# Patient Record
Sex: Female | Born: 1980 | Race: Black or African American | Hispanic: No | Marital: Single | Smoking: Former smoker
Health system: Southern US, Community
[De-identification: ages and names within clinical notes are randomized; demographics above are authoritative.]

## PROBLEM LIST (undated history)

## (undated) DIAGNOSIS — I10 Essential (primary) hypertension: Secondary | ICD-10-CM

## (undated) DIAGNOSIS — I2510 Atherosclerotic heart disease of native coronary artery without angina pectoris: Secondary | ICD-10-CM

## (undated) DIAGNOSIS — G43109 Migraine with aura, not intractable, without status migrainosus: Secondary | ICD-10-CM

## (undated) DIAGNOSIS — R87613 High grade squamous intraepithelial lesion on cytologic smear of cervix (HGSIL): Secondary | ICD-10-CM

## (undated) DIAGNOSIS — R51 Headache: Secondary | ICD-10-CM

## (undated) DIAGNOSIS — F419 Anxiety disorder, unspecified: Secondary | ICD-10-CM

## (undated) HISTORY — DX: Migraine with aura, not intractable, without status migrainosus: G43.109

## (undated) HISTORY — PX: DILATION AND CURETTAGE OF UTERUS: SHX78

## (undated) HISTORY — DX: High grade squamous intraepithelial lesion on cytologic smear of cervix (HGSIL): R87.613

## (undated) HISTORY — DX: Atherosclerotic heart disease of native coronary artery without angina pectoris: I25.10

## (undated) HISTORY — PX: OTHER SURGICAL HISTORY: SHX169

## (undated) HISTORY — DX: Anxiety disorder, unspecified: F41.9

---
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
## 1999-03-02 ENCOUNTER — Encounter: Admission: RE | Admit: 1999-03-02 | Discharge: 1999-03-02 | Payer: Self-pay | Admitting: Sports Medicine

## 1999-07-20 ENCOUNTER — Emergency Department (HOSPITAL_COMMUNITY): Admission: EM | Admit: 1999-07-20 | Discharge: 1999-07-20 | Payer: Self-pay | Admitting: Emergency Medicine

## 2000-08-07 ENCOUNTER — Encounter: Admission: RE | Admit: 2000-08-07 | Discharge: 2000-08-07 | Payer: Self-pay | Admitting: Family Medicine

## 2001-01-12 ENCOUNTER — Emergency Department (HOSPITAL_COMMUNITY): Admission: EM | Admit: 2001-01-12 | Discharge: 2001-01-12 | Payer: Self-pay | Admitting: Emergency Medicine

## 2001-08-14 ENCOUNTER — Encounter: Admission: RE | Admit: 2001-08-14 | Discharge: 2001-08-14 | Payer: Self-pay | Admitting: Family Medicine

## 2002-06-17 ENCOUNTER — Encounter: Admission: RE | Admit: 2002-06-17 | Discharge: 2002-06-17 | Payer: Self-pay | Admitting: Family Medicine

## 2002-11-19 ENCOUNTER — Encounter: Admission: RE | Admit: 2002-11-19 | Discharge: 2002-11-19 | Payer: Self-pay | Admitting: Family Medicine

## 2002-12-26 ENCOUNTER — Encounter: Admission: RE | Admit: 2002-12-26 | Discharge: 2002-12-26 | Payer: Self-pay | Admitting: Sports Medicine

## 2003-01-15 ENCOUNTER — Emergency Department (HOSPITAL_COMMUNITY): Admission: EM | Admit: 2003-01-15 | Discharge: 2003-01-15 | Payer: Self-pay | Admitting: *Deleted

## 2004-03-02 ENCOUNTER — Emergency Department (HOSPITAL_COMMUNITY): Admission: EM | Admit: 2004-03-02 | Discharge: 2004-03-02 | Payer: Self-pay | Admitting: Emergency Medicine

## 2004-03-18 ENCOUNTER — Inpatient Hospital Stay (HOSPITAL_COMMUNITY): Admission: AD | Admit: 2004-03-18 | Discharge: 2004-03-18 | Payer: Self-pay | Admitting: Obstetrics & Gynecology

## 2004-03-22 ENCOUNTER — Encounter: Admission: RE | Admit: 2004-03-22 | Discharge: 2004-03-22 | Payer: Self-pay | Admitting: Family Medicine

## 2004-03-23 ENCOUNTER — Encounter (INDEPENDENT_AMBULATORY_CARE_PROVIDER_SITE_OTHER): Payer: Self-pay | Admitting: *Deleted

## 2004-03-23 ENCOUNTER — Encounter: Admission: RE | Admit: 2004-03-23 | Discharge: 2004-03-23 | Payer: Self-pay | Admitting: Family Medicine

## 2006-04-28 ENCOUNTER — Ambulatory Visit: Payer: Self-pay | Admitting: Family Medicine

## 2006-04-29 ENCOUNTER — Emergency Department (HOSPITAL_COMMUNITY): Admission: EM | Admit: 2006-04-29 | Discharge: 2006-04-29 | Payer: Self-pay | Admitting: Family Medicine

## 2006-05-09 ENCOUNTER — Ambulatory Visit: Payer: Self-pay | Admitting: Sports Medicine

## 2006-06-12 ENCOUNTER — Ambulatory Visit: Payer: Self-pay | Admitting: Family Medicine

## 2006-06-15 ENCOUNTER — Encounter: Admission: RE | Admit: 2006-06-15 | Discharge: 2006-06-15 | Payer: Self-pay | Admitting: Sports Medicine

## 2007-02-06 ENCOUNTER — Encounter (INDEPENDENT_AMBULATORY_CARE_PROVIDER_SITE_OTHER): Payer: Self-pay | Admitting: Family Medicine

## 2007-02-06 ENCOUNTER — Ambulatory Visit: Payer: Self-pay | Admitting: Family Medicine

## 2007-02-06 LAB — CONVERTED CEMR LAB
Chlamydia, DNA Probe: NEGATIVE
HCT: 37.5 % (ref 36.0–46.0)
MCV: 85.2 fL (ref 78.0–100.0)
Monocytes Absolute: 0.4 10*3/uL (ref 0.2–0.7)
Monocytes Relative: 5 % (ref 3–11)
Neutro Abs: 4.7 10*3/uL (ref 1.7–7.7)
Neutrophils Relative %: 59 % (ref 43–77)
Platelets: 316 10*3/uL (ref 150–400)

## 2007-02-15 DIAGNOSIS — N938 Other specified abnormal uterine and vaginal bleeding: Secondary | ICD-10-CM | POA: Insufficient documentation

## 2007-02-16 ENCOUNTER — Encounter (INDEPENDENT_AMBULATORY_CARE_PROVIDER_SITE_OTHER): Payer: Self-pay | Admitting: *Deleted

## 2008-05-07 ENCOUNTER — Telehealth: Payer: Self-pay | Admitting: *Deleted

## 2008-05-07 ENCOUNTER — Ambulatory Visit: Payer: Self-pay | Admitting: Family Medicine

## 2008-05-07 DIAGNOSIS — K089 Disorder of teeth and supporting structures, unspecified: Secondary | ICD-10-CM | POA: Insufficient documentation

## 2008-06-30 ENCOUNTER — Encounter: Payer: Self-pay | Admitting: *Deleted

## 2008-07-17 ENCOUNTER — Ambulatory Visit: Payer: Self-pay | Admitting: Family Medicine

## 2008-07-17 DIAGNOSIS — M79609 Pain in unspecified limb: Secondary | ICD-10-CM | POA: Insufficient documentation

## 2010-05-31 ENCOUNTER — Emergency Department (HOSPITAL_COMMUNITY): Admission: EM | Admit: 2010-05-31 | Discharge: 2010-06-01 | Payer: Self-pay | Admitting: Emergency Medicine

## 2010-12-14 ENCOUNTER — Ambulatory Visit: Payer: Self-pay | Admitting: Family Medicine

## 2010-12-14 DIAGNOSIS — G43909 Migraine, unspecified, not intractable, without status migrainosus: Secondary | ICD-10-CM | POA: Insufficient documentation

## 2011-01-20 NOTE — Assessment & Plan Note (Signed)
Summary: CPE/headaches   Vital Signs:  Patient profile:   30 year old female Height:      65.5 inches Weight:      175.6 pounds BMI:     28.88 Temp:     98.7 degrees F oral Pulse rate:   97 / minute BP sitting:   139 / 96  (right arm) Cuff size:   regular  Vitals Entered By: Jimmy Footman, CMA (December 14, 2010 11:41 AM) CC: cpe w/o pap Is Patient Diabetic? No Pain Assessment Patient in pain? no        Primary Care Mak[place] Bare:  . [facility]  CC:  cpe w/o pap.  History of Present Illness: Pt is doing well, she has no complaints other than her headaches.   Headaches: Pt comes in with headaches that are becoming more constant. She is sensitive to light, finds that they are often Rt sided, they occure about 2x a week. she has had a CT scan and MRI about 2 months ago. She is not taking any regular medicine for her headaches. She has no insurance and can not afford expensice migraine medicine right now. Occasionally take Flexerill for her headaches but says this doesn't work well.     Habits & Providers  Alcohol-Tobacco-Diet     Tobacco Status: never  Current Medications (verified): 1)  Propranolol Hcl 10 Mg Tabs (Propranolol Hcl) .... Take 3 Tablets, Every 8 Hours For 1 Month.  Allergies (verified): No Known Drug Allergies  Social History: Smoking Status:  never  Review of Systems        vitals reviewed and pertinent negatives and positives seen in HPI   Physical Exam  General:  Well-developed,well-nourished,in no acute distress; alert,appropriate and cooperative throughout examination Head:  Normocephalic and atraumatic without obvious abnormalities. No apparent alopecia or balding. Eyes:  No corneal or conjunctival inflammation noted. EOMI. [place].  Vision grossly normal. Ears:  External ear exam shows no significant lesions or deformities.  Otoscopic examination reveals clear canals, tympanic membranes are intact bilaterally without bulging, retraction,  inflammation or discharge. Hearing is grossly normal bilaterally. Nose:  External nasal examination shows no deformity or inflammation. Nasal mucosa are pink and moist without lesions or exudates. Mouth:  Oral mucosa and oropharynx without lesions or exudates.  Teeth in good repair. Neck:  No deformities, masses, or tenderness noted. Lungs:  Normal respiratory effort, chest expands symmetrically. Lungs are clear to auscultation, no crackles or wheezes. Heart:  Normal rate and regular rhythm. S1 and S2 normal without gallop, murmur, click, rub or other extra sounds. Abdomen:  Bowel sounds positive,abdomen soft and non-tender without masses, organomegaly or hernias noted. Msk:  No deformity or scoliosis noted of thoracic or lumbar spine.   Extremities:  No clubbing, cyanosis, edema, or deformity noted with normal full range of motion of all joints.   Skin:  Intact without suspicious lesions or rashes Psych:  Cognition and judgment appear intact. Alert and cooperative with normal attention span and concentration. No apparent delusions, illusions, hallucinations   Impression & Recommendations:  Problem # 1:  PHYSICAL EXAMINATION (ICD-V70.0) Assessment Unchanged Pt is doing well, except she has headaches...see below.   Orders: [facility] - Est  18-39 yrs (16109)  Problem # 2:  MIGRAINE HEADACHE (ICD-346.90) Assessment: New Pt meets criteria for migraine headaches. She has had imaging which was negative. She can't afford the abortive meds but we discussed starting a preventive medicine to see if she can decrease her frequency of headaches. Warned of possible  side effects with low BP.   Her updated medication list for this problem includes:    Propranolol Hcl 10 Mg Tabs (Propranolol hcl) .Marland Kitchen... Take 3 tablets, every 8 hours for 1 month.  Orders: [facility] - Est  18-39 yrs (41324)  Complete Medication List: 1)  Propranolol Hcl 10 Mg Tabs (Propranolol hcl) .... Take 3 tablets, every 8 hours for 1  month.  Patient Instructions: 1)  given verbal instructions Prescriptions: PROPRANOLOL HCL 10 MG TABS (PROPRANOLOL HCL) take 3 tablets, every 8 hours for 1 month.  #180 x 1   Entered and Authorized by:   Jamie Brookes MD   Signed by:   Jamie Brookes MD on 12/14/2010   Method used:   Electronically to        [id number] (retail)       [address] [place], [place]  [address]       Ph: [phone]       Fax: [phone]   RxID:   [id number]    Orders Added: 1)  [facility] - Est  18-39 yrs [60109]

## 2011-03-07 LAB — CBC
HCT: 36.4 % (ref 36.0–46.0)
Hemoglobin: 12.2 g/dL (ref 12.0–15.0)
MCV: 89.1 fL (ref 78.0–100.0)
Platelets: 300 10*3/uL (ref 150–400)
RBC: 4.09 MIL/uL (ref 3.87–5.11)

## 2011-03-07 LAB — BASIC METABOLIC PANEL
BUN: 9 mg/dL (ref 6–23)
Creatinine, Ser: 0.67 mg/dL (ref 0.4–1.2)
GFR calc Af Amer: >60 mL/min (ref >60)
GFR calc non Af Amer: >60 mL/min (ref >60)

## 2011-03-07 LAB — URINALYSIS, ROUTINE W REFLEX MICROSCOPIC
Glucose, UA: NEGATIVE mg/dL
Nitrite: NEGATIVE
Protein, ur: NEGATIVE mg/dL
Specific Gravity, Urine: 1.008 (ref 1.005–1.030)
Urobilinogen, UA: 0.2 mg/dL (ref 0.0–1.0)
pH: 6.5 (ref 5.0–8.0)

## 2011-03-07 LAB — DIFFERENTIAL
Lymphs Abs: 2.9 10*3/uL (ref 0.7–4.0)
Monocytes Absolute: 0.4 10*3/uL (ref 0.1–1.0)
Neutro Abs: 5.3 10*3/uL (ref 1.7–7.7)
Neutrophils Relative %: 60 % (ref 43–77)

## 2011-03-07 LAB — URINE MICROSCOPIC-ADD ON

## 2011-03-07 LAB — PROTIME-INR: Prothrombin Time: 13 seconds (ref 11.6–15.2)

## 2011-03-07 LAB — POCT PREGNANCY, URINE: Preg Test, Ur: NEGATIVE

## 2011-03-24 ENCOUNTER — Encounter: Payer: Self-pay | Admitting: Family Medicine

## 2011-03-31 ENCOUNTER — Encounter: Payer: Self-pay | Admitting: Family Medicine

## 2011-04-06 ENCOUNTER — Other Ambulatory Visit (HOSPITAL_COMMUNITY)
Admission: RE | Admit: 2011-04-06 | Discharge: 2011-04-06 | Disposition: A | Discharge status: Home / Self Care | Payer: PRIVATE HEALTH INSURANCE | Source: Ambulatory Visit | Attending: Family Medicine | Admitting: Family Medicine

## 2011-04-07 ENCOUNTER — Ambulatory Visit (INDEPENDENT_AMBULATORY_CARE_PROVIDER_SITE_OTHER): Payer: PRIVATE HEALTH INSURANCE | Admitting: Family Medicine

## 2011-04-07 ENCOUNTER — Encounter: Payer: Self-pay | Admitting: Family Medicine

## 2011-04-07 VITALS — BP 132/84 | HR 80 | Temp 98.8°F | Ht 65.0 in | Wt 176.0 lb

## 2011-04-07 DIAGNOSIS — Z124 Encounter for screening for malignant neoplasm of cervix: Secondary | ICD-10-CM

## 2011-04-07 DIAGNOSIS — N760 Acute vaginitis: Secondary | ICD-10-CM

## 2011-04-07 DIAGNOSIS — Z01419 Encounter for gynecological examination (general) (routine) without abnormal findings: Secondary | ICD-10-CM | POA: Insufficient documentation

## 2011-04-07 NOTE — Progress Notes (Signed)
  Subjective:    Patient ID: Kaylee Spencer, female    DOB: 09/05/1981, 30 y.o.   MRN: [id number]  HPI  Had physical exam 12/14/10.  Did not hav opportunity to get pap smear, here to collect pap today.  Vaginitis: not currently having today but notes has this cyclically with menses.  States cyclically premenstrually with odor.  Has tried OTC supposities without improvement.  Patient is distressed by this vaginal odor.  No pelvic pain.  Review of Systemssee hpi    Objective:   Physical Exam  Constitutional: She appears well-developed and well-nourished.  Genitourinary: Vagina normal and uterus normal. There is no rash, tenderness, lesion or injury on the right labia. There is no rash, tenderness, lesion or injury on the left labia. Cervix exhibits no motion tenderness, no discharge and no friability. Right adnexum displays no mass, no tenderness and no fullness. Left adnexum displays no mass and no fullness. No bleeding around the vagina.          Assessment & Plan:

## 2011-04-07 NOTE — Patient Instructions (Signed)
Schedule follow-up with your PCP as needed Will send you results of pap test in next 1-2 weeks.  If abnormal, will call to discuss

## 2011-04-11 DIAGNOSIS — N760 Acute vaginitis: Secondary | ICD-10-CM | POA: Insufficient documentation

## 2011-04-11 NOTE — Assessment & Plan Note (Signed)
Since not currently occuring, will not check wet prep.  Advised patient on normal change in vaginal mucous in menstrual cycle.  She will return when having vaginal discharge if testing desired.

## 2011-04-13 ENCOUNTER — Encounter: Payer: Self-pay | Admitting: Family Medicine

## 2011-07-22 ENCOUNTER — Ambulatory Visit (INDEPENDENT_AMBULATORY_CARE_PROVIDER_SITE_OTHER): Payer: PRIVATE HEALTH INSURANCE | Admitting: Family Medicine

## 2011-07-22 VITALS — BP 126/74 | HR 101 | Temp 98.5°F | Wt 182.0 lb

## 2011-07-22 DIAGNOSIS — G43909 Migraine, unspecified, not intractable, without status migrainosus: Secondary | ICD-10-CM

## 2011-07-22 MED ORDER — SUMATRIPTAN SUCCINATE 50 MG PO TABS: ORAL_TABLET | ORAL | Status: DC

## 2011-07-22 MED ORDER — TOPIRAMATE 25 MG PO TABS: 25.0000 mg | ORAL_TABLET | Freq: Every day | ORAL | Status: DC

## 2011-07-22 MED ORDER — KETOROLAC TROMETHAMINE 30 MG/ML IJ SOLN
30.0000 mg | Freq: Once | INTRAMUSCULAR | Status: AC
  Administered 2011-07-22: 30 mg via INTRAMUSCULAR

## 2011-07-22 NOTE — Assessment & Plan Note (Signed)
Migraine vs medication overuse headache.  toradol in office today  Start topamax for prevention Wean NSAIDs Headache log to go back to PCP with ? [facility] to [facility]

## 2011-07-22 NOTE — Progress Notes (Signed)
Addended by: Garen Grams F on: 07/22/2011 09:57 AM   Modules accepted: Orders

## 2011-07-22 NOTE — Progress Notes (Signed)
  Subjective:    Kaylee Spencer is a 30 y.o. female who presents for follow-up of migraine headaches and rebound headaches due to excessive medication use. Home treatment has included motrin and aleve with little improvement. Headaches are occurring continuously. Generally, the headaches last about several days. Work attendance or other daily activities are not affected by the headaches. The patient denies depression, loss of balance, speech difficulties and vision problems.     Review of Systems A comprehensive review of systems was negative.    Objective:    BP 126/74  Pulse 101  Temp(Src) 98.5 F (36.9 C) (Oral)  Wt 182 lb (82.555 kg)  LMP 07/22/2011 General appearance: alert, cooperative and mild distress Neurologic: Alert and oriented X 3, normal strength and tone. Normal symmetric reflexes. Normal coordination and gait    Assessment:    Analgesic rebound headache Common migraine    Plan:    Prophylactic therapy: topamax, sumatriptan due to high frequency of pain. Asked to keep headache diary. Follow up in 3 weeks.

## 2011-07-22 NOTE — Patient Instructions (Addendum)
I am sorry you are having headaches  I am going to give you a shot of toradol today to help break your headache  Please start to wean the motrin or aleve...you only want to take it twice a week at most or you will get a headache from that  For home, I would like you to start topamax and have imitrex at home.   Take the topamax daily and the immitrex at the first sign of headache  Make an appt to come back to see your regular doctor in 1 month  During that time, keep a log of every headache you have and what you think might have triggered it

## 2011-08-04 ENCOUNTER — Ambulatory Visit (INDEPENDENT_AMBULATORY_CARE_PROVIDER_SITE_OTHER): Payer: PRIVATE HEALTH INSURANCE | Admitting: Family Medicine

## 2011-08-04 ENCOUNTER — Encounter: Payer: Self-pay | Admitting: Family Medicine

## 2011-08-04 VITALS — BP 140/97 | HR 107 | Temp 97.5°F | Ht 65.0 in | Wt 182.0 lb

## 2011-08-04 DIAGNOSIS — J329 Chronic sinusitis, unspecified: Secondary | ICD-10-CM

## 2011-08-04 MED ORDER — AMOXICILLIN-POT CLAVULANATE 875-125 MG PO TABS: 1.0000 | ORAL_TABLET | Freq: Two times a day (BID) | ORAL | Status: AC

## 2011-08-04 NOTE — Patient Instructions (Signed)
Your nasal passages are very swollen and blocked up. Because this has been going on so long, we will treat you for a sinus infection.  Take the antibiotic for the next 7 days, twice daily.   Use Afrin for ONLY 3 DAYS to help decrease the swelling.   The other things that help are Claritin, Allegra, or Zyrtec.  Find one that works for you.  This should help prevent swelling.  A more long-term nasal spray can also decrease the swelling, but let's start with this today.  It was good to meet you today

## 2011-08-06 DIAGNOSIS — J329 Chronic sinusitis, unspecified: Secondary | ICD-10-CM | POA: Insufficient documentation

## 2011-08-06 NOTE — Assessment & Plan Note (Signed)
Will treat as present for about 14 days, due to signs on exam, history. Augmentin  FU if no improvement

## 2011-08-06 NOTE — Progress Notes (Signed)
  Subjective:    Patient ID: Kaylee Spencer, female    DOB: February 28, 1981, 30 y.o.   MRN: [id number]  HPI Headaches:  Patient with chronic headaches.  Since being seen earlier this month, headache has persisted.  No relief from prescribed medications. Several days after leaving office, she began to suffer from nasal congestion, nasal drainage, worsening headache in band around eyes.  Some minimal relief with Ibuprofen.  Has felt hot but never taken temperature.  No cough or sore throat.  Returns today b/c headache feels different from migraines and she is concerned for illness.  Some facial pain radiates into teeth.     Review of Systems See HPI above for review of systems.      Objective:   Physical Exam Gen:  Alert, cooperative patient who appears stated age in no acute distress.  Vital signs reviewed. Head:  [place]/AT Eyes:  PERRL, EOMI, sclera white, no conjunctivitis noted Nose:  BL edematous and erythematous nasal turbinates.  Unable to appreciate nasal passage into pharynx due to swelling.  Some exudates noted.  Tenderness to palpation BL maxillary sinuses Mouth:  MMM, pink.  No tonsillar hypertrophy.   Neck: No masses or thyromegaly or limitation in range of motion.  No cervical lymphadenopathy. Pulm:  Clear to auscultation bilaterally with good air movement.  No wheezes or rales noted.           Assessment & Plan:

## 2011-08-24 ENCOUNTER — Telehealth: Payer: Self-pay | Admitting: Family Medicine

## 2011-08-24 NOTE — Telephone Encounter (Signed)
Pt was asked to return with a headache diary.  She should follow up with a [facility] doctor.

## 2011-08-24 NOTE — Telephone Encounter (Signed)
Kaylee Spencer was seen on 8/3 with Dr. Hulen Luster for headaches.  Given some info for [facility], but has missplaced.  Would like referral for an appt with th em.  Please call pat with info asap

## 2011-08-24 NOTE — Telephone Encounter (Signed)
Dr. Annita Brod, Patient seen you for office visit beginning of August for migraine and says you were to give her a referral.

## 2011-10-31 ENCOUNTER — Ambulatory Visit: Payer: PRIVATE HEALTH INSURANCE | Admitting: Family Medicine

## 2012-04-27 ENCOUNTER — Ambulatory Visit (INDEPENDENT_AMBULATORY_CARE_PROVIDER_SITE_OTHER): Payer: PRIVATE HEALTH INSURANCE | Admitting: Family Medicine

## 2012-04-27 ENCOUNTER — Encounter: Payer: Self-pay | Admitting: Family Medicine

## 2012-04-27 VITALS — BP 140/108 | HR 117 | Temp 98.3°F | Ht 65.0 in | Wt 201.2 lb

## 2012-04-27 DIAGNOSIS — F411 Generalized anxiety disorder: Secondary | ICD-10-CM

## 2012-04-27 DIAGNOSIS — G43909 Migraine, unspecified, not intractable, without status migrainosus: Secondary | ICD-10-CM

## 2012-04-27 DIAGNOSIS — G4700 Insomnia, unspecified: Secondary | ICD-10-CM

## 2012-04-27 DIAGNOSIS — F419 Anxiety disorder, unspecified: Secondary | ICD-10-CM | POA: Insufficient documentation

## 2012-04-27 MED ORDER — PROPRANOLOL HCL 40 MG PO TABS: 40.0000 mg | ORAL_TABLET | Freq: Two times a day (BID) | ORAL | Status: AC

## 2012-04-27 MED ORDER — TRAZODONE HCL 50 MG PO TABS: 50.0000 mg | ORAL_TABLET | Freq: Every day | ORAL | Status: DC

## 2012-04-27 NOTE — Assessment & Plan Note (Addendum)
Will prescribe trazodone, this will help her sleep and help with her anxiety/depressive symptoms.  Follow up in two weeks.

## 2012-04-27 NOTE — Patient Instructions (Signed)
Meds ordered this encounter  Medications  . propranolol (INDERAL) 40 MG tablet    Sig: Take 1 tablet (40 mg total) by mouth 2 (two) times daily.    Dispense:  60 tablet    Refill:  1  . traZODone (DESYREL) 50 MG tablet    Sig: Take 1 tablet (50 mg total) by mouth at bedtime.    Dispense:  30 tablet    Refill:  3  These are your new medications. Give them two weeks to work. F/u with your PCP here in a couple weeks or longer to see how things are going.

## 2012-04-27 NOTE — Assessment & Plan Note (Addendum)
No longer taking topamax because of side effects.  Stopped imitrex. Will prescribe propranolol - this will help her anxiety, heart flutter and BP as well. Explained side effects. F/u in two weeks.

## 2012-04-27 NOTE — Progress Notes (Signed)
  Subjective:   Patient ID: Kaylee Spencer, female DOB: [birth year] 31 y.o. MRN: [id number] HPI:  1. Migraine headaches  Onset: has been chronic and recurrent  Time period of:  year(s).  Severity is described as moderate to severe. 9/10 at worst Course of her symptoms over time is periodic. 2x/week Aggravating: anxiety Alleviating: sleep, quit, darkness, Excedrin migraine helps sometimes Associated sx/sn: was taking topamax but made her depressed so she stopped it. Imitrex didn't help.  No n/v. She does have photo and light sensitivity.   2. Insomnia  Onset: has been chronic  Time period of:  year(s).  Severity as :very difficult to fall asleep.  Course of her symptoms over time is chronic. Aggravating: none, no caffeine, diet beverages, good sleep hygeine Alleviating: none Associated sx/sn: no sleep apnea, no night terrors. No drugs.   History  Substance Use Topics  . Smoking status: Never Smoker   . Smokeless tobacco: Not on file  . Alcohol Use: No   Review of Systems: Pertinent items are noted in HPI.  Labs Reviewed: none    Objective:   Filed Vitals:   04/27/12 1408  BP: 140/108  Pulse: 117  Temp: 98.3 F (36.8 C)  TempSrc: Oral  Height: 5\' 5"  (1.651 m)  Weight: 201 lb 3.2 oz (91.264 kg)   Physical Exam: General: AAF, nad, pleasant Neurologic exam : Cn 2-7 intact Able to walk normally Psych:  Cognition and judgment appear intact. Alert, communicative  and cooperative with normal attention span and concentration. No apparent delusions, illusions, hallucinations Assessment & Plan:

## 2012-05-24 ENCOUNTER — Encounter: Payer: Self-pay | Admitting: Family Medicine

## 2012-05-24 ENCOUNTER — Ambulatory Visit (INDEPENDENT_AMBULATORY_CARE_PROVIDER_SITE_OTHER): Payer: PRIVATE HEALTH INSURANCE | Admitting: Family Medicine

## 2012-05-24 ENCOUNTER — Encounter (HOSPITAL_COMMUNITY): Payer: Self-pay | Admitting: *Deleted

## 2012-05-24 ENCOUNTER — Inpatient Hospital Stay (HOSPITAL_COMMUNITY)
Admission: AD | Admit: 2012-05-24 | Discharge: 2012-05-24 | Disposition: A | Discharge status: Home / Self Care | Payer: PRIVATE HEALTH INSURANCE | Source: Ambulatory Visit | Attending: Obstetrics & Gynecology | Admitting: Obstetrics & Gynecology

## 2012-05-24 VITALS — BP 136/98 | HR 106 | Temp 98.5°F | Ht 65.0 in | Wt 203.8 lb

## 2012-05-24 DIAGNOSIS — F3289 Other specified depressive episodes: Secondary | ICD-10-CM

## 2012-05-24 DIAGNOSIS — G43909 Migraine, unspecified, not intractable, without status migrainosus: Secondary | ICD-10-CM

## 2012-05-24 DIAGNOSIS — N938 Other specified abnormal uterine and vaginal bleeding: Secondary | ICD-10-CM

## 2012-05-24 DIAGNOSIS — F32A Depression, unspecified: Secondary | ICD-10-CM | POA: Insufficient documentation

## 2012-05-24 DIAGNOSIS — N949 Unspecified condition associated with female genital organs and menstrual cycle: Secondary | ICD-10-CM

## 2012-05-24 DIAGNOSIS — F329 Major depressive disorder, single episode, unspecified: Secondary | ICD-10-CM

## 2012-05-24 HISTORY — DX: Headache: R51

## 2012-05-24 HISTORY — DX: Essential (primary) hypertension: I10

## 2012-05-24 LAB — CBC
MCH: 28.1 pg (ref 26.0–34.0)
MCHC: 33.8 g/dL (ref 30.0–36.0)
Platelets: 291 10*3/uL (ref 150–400)
RDW: 13.8 % (ref 11.5–15.5)

## 2012-05-24 LAB — URINALYSIS, ROUTINE W REFLEX MICROSCOPIC
Bilirubin Urine: NEGATIVE
Ketones, ur: NEGATIVE mg/dL
Protein, ur: NEGATIVE mg/dL
Specific Gravity, Urine: 1.015 (ref 1.005–1.030)
pH: 6 (ref 5.0–8.0)

## 2012-05-24 LAB — WET PREP, GENITAL: Yeast Wet Prep HPF POC: NONE SEEN

## 2012-05-24 LAB — URINE MICROSCOPIC-ADD ON

## 2012-05-24 MED ORDER — MEDROXYPROGESTERONE ACETATE 10 MG PO TABS: 10.0000 mg | ORAL_TABLET | Freq: Every day | ORAL | Status: DC

## 2012-05-24 MED ORDER — TRAMADOL HCL 50 MG PO TABS: 50.0000 mg | ORAL_TABLET | Freq: Three times a day (TID) | ORAL | Status: DC | PRN

## 2012-05-24 MED ORDER — VENLAFAXINE HCL ER 37.5 MG PO CP24: 37.5000 mg | ORAL_CAPSULE | Freq: Every day | ORAL | Status: DC

## 2012-05-24 NOTE — Discharge Instructions (Signed)
Come to [facility] next Thursday, June 13th for ultrasound at 0930, please be here at 0915.   Abnormal Uterine Bleeding Abnormal uterine bleeding can have many causes. Some cases are simply treated, while others are more serious. There are several kinds of bleeding that is considered abnormal, including:  Bleeding between periods.   Bleeding after sexual intercourse.   Spotting anytime in the menstrual cycle.   Bleeding heavier or more than normal.   Bleeding after menopause.  CAUSES  There are many causes of abnormal uterine bleeding. It can be present in teenagers, pregnant women, women during their reproductive years, and women who have reached menopause. Your caregiver will look for the more common causes depending on your age, signs, symptoms and your particular circumstance. Most cases are not serious and can be treated. Even the more serious causes, like cancer of the female organs, can be treated adequately if found in the early stages. That is why all types of bleeding should be evaluated and treated as soon as possible. DIAGNOSIS  Diagnosing the cause may take several kinds of tests. Your caregiver may:  Take a complete history of the type of bleeding.   Perform a complete physical exam and Pap smear.   Take an ultrasound on the abdomen showing a picture of the female organs and the pelvis.   Inject dye into the uterus and Fallopian tubes and X-ray them (hysterosalpingogram).   Place fluid in the uterus and do an ultrasound (sonohysterogrqphy).   Take a CT scan to examine the female organs and pelvis.   Take an MRI to examine the female organs and pelvis. There is no X-ray involved with this procedure.   Look inside the uterus with a telescope that has a light at the end (hysteroscopy).   Scrap the inside of the uterus to get tissue to examine (Dilatation and Curettage, D&C).   Look into the pelvis with a telescope that has a light at the end (laparoscopy). This  is done through a very small cut (incision) in the abdomen.  TREATMENT  Treatment will depend on the cause of the abnormal bleeding. It can include:  Doing nothing to allow the problem to take care of itself over time.   Hormone treatment.   Birth control pills.   Treating the medical condition causing the problem.   Laparoscopy.   Major or minor surgery   Destroying the lining of the uterus with electrical currant, laser, freezing or heat (uterine ablation).  HOME CARE INSTRUCTIONS   Follow your caregiver's recommendation on how to treat your problem.   See your caregiver if you missed a menstrual period and think you may be pregnant.   If you are bleeding heavily, count the number of pads/tampons you use and how often you have to change them. Tell this to your caregiver.   Avoid sexual intercourse until the problem is controlled.  SEEK MEDICAL CARE IF:   You have any kind of abnormal bleeding mentioned above.   You feel dizzy at times.   You are 31 years old and have not had a menstrual period yet.  SEEK IMMEDIATE MEDICAL CARE IF:   You pass out.   You are changing pads/tampons every 15 to 30 minutes.   You have belly (abdominal) pain.   You have a temperature of 100 F (37.8 C) or higher.   You become sweaty or weak.   You are passing large blood clots from the vagina.   You start to feel sick  to your stomach (nauseous) and throw up (vomit).  Document Released: 12/05/2005 Document Revised: 11/24/2011 Document Reviewed: 04/30/2009 [facility] Patient Information 2012 [place], [place].

## 2012-05-24 NOTE — Assessment & Plan Note (Signed)
Provera x 10 days per MAU. [place] this week.

## 2012-05-24 NOTE — MAU Provider Note (Signed)
History     CSN: [id number]  Arrival date and time: 05/24/12 1127   None     Chief Complaint  Patient presents with  . Vaginal Bleeding  . Rectal Pain   HPI  Pt in c/o irregular bleeding since February, became very heavy yesterday, changing tampons every 30 minutes, and using 2 at a time. Reports passing baseball sized clots. Denies any hx of fibroids. Reports lower abdominal cramping.     Past Medical History  Diagnosis Date  . Hypertension   . Headache     Past Surgical History  Procedure Date  . Boil removal   . Dilation and curettage of uterus     Family History  Problem Relation Age of Onset  . Anesthesia problems Neg Hx     History  Substance Use Topics  . Smoking status: Never Smoker   . Smokeless tobacco: Not on file  . Alcohol Use: No    Allergies: No Known Allergies  Prescriptions prior to admission  Medication Sig Dispense Refill  . propranolol (INDERAL) 40 MG tablet Take 1 tablet (40 mg total) by mouth 2 (two) times daily.  60 tablet  1  . traZODone (DESYREL) 50 MG tablet Take 1 tablet (50 mg total) by mouth at bedtime.  30 tablet  3    Review of Systems  Gastrointestinal: Positive for abdominal pain.  Genitourinary:       Vaginal bleeding  All other systems reviewed and are negative.   Physical Exam   Blood pressure 144/86, pulse 97, temperature 98.6 F (37 C), temperature source Oral, resp. rate 18, height 5\' 5"  (1.651 m), weight 92.262 kg (203 lb 6.4 oz), last menstrual period 05/23/2012, SpO2 100.00%.  Physical Exam  Constitutional: She is oriented to person, place, and time. She appears well-developed and well-nourished.  HENT:  Head: Normocephalic.  Neck: Normal range of motion. Neck supple.  Cardiovascular: Normal rate, regular rhythm and normal heart sounds.   Respiratory: Effort normal and breath sounds normal.  GI: Soft. She exhibits no mass. There is tenderness. There is no guarding.  Genitourinary: Uterus is enlarged and  tender (generalize). Cervix exhibits no motion tenderness. There is bleeding (scant, no clots) around the vagina.  Neurological: She is alert and oriented to person, place, and time. She has normal reflexes.  Skin: Skin is warm and dry.    MAU Course  Procedures  Results for orders placed during the hospital encounter of 05/24/12 (from the past 24 hour(s))  URINALYSIS, ROUTINE W REFLEX MICROSCOPIC     Status: Abnormal   Collection Time   05/24/12 11:45 AM      Component Value Range   Color, Urine YELLOW  YELLOW    APPearance CLEAR  CLEAR    Specific Gravity, Urine 1.015  1.005 - 1.030    pH 6.0  5.0 - 8.0    Glucose, UA NEGATIVE  NEGATIVE (mg/dL)   Hgb urine dipstick MODERATE (*) NEGATIVE    Bilirubin Urine NEGATIVE  NEGATIVE    Ketones, ur NEGATIVE  NEGATIVE (mg/dL)   Protein, ur NEGATIVE  NEGATIVE (mg/dL)   Urobilinogen, UA 0.2  0.0 - 1.0 (mg/dL)   Nitrite NEGATIVE  NEGATIVE    Leukocytes, UA NEGATIVE  NEGATIVE   URINE MICROSCOPIC-ADD ON     Status: Abnormal   Collection Time   05/24/12 11:45 AM      Component Value Range   Squamous Epithelial / LPF FEW (*) RARE    RBC / HPF 3-6  <  3 (RBC/hpf)  POCT PREGNANCY, URINE     Status: Normal   Collection Time   05/24/12 12:12 PM      Component Value Range   Preg Test, Ur NEGATIVE  NEGATIVE   CBC     Status: Abnormal   Collection Time   05/24/12 12:35 PM      Component Value Range   WBC 8.0  4.0 - 10.5 (K/uL)   RBC 4.23  3.87 - 5.11 (MIL/uL)   Hemoglobin 11.9 (*) 12.0 - 15.0 (g/dL)   HCT 16.1 (*) 09.6 - 46.0 (%)   MCV 83.2  78.0 - 100.0 (fL)   MCH 28.1  26.0 - 34.0 (pg)   MCHC 33.8  30.0 - 36.0 (g/dL)   RDW 04.5  40.9 - 81.1 (%)   Platelets 291  150 - 400 (K/uL)  WET PREP, GENITAL     Status: Normal   Collection Time   05/24/12  1:07 PM      Component Value Range   Yeast Wet Prep HPF POC NONE SEEN  NONE SEEN    Trich, Wet Prep NONE SEEN  NONE SEEN    Clue Cells Wet Prep HPF POC NONE SEEN  NONE SEEN    WBC, Wet Prep HPF POC NONE  SEEN  NONE SEEN      Assessment and Plan  Dysfunctional Uterine Bleeding  Plan: Schedule outpatient ultrasound RX Provera Follow-up at [facility]   [facility] 05/24/2012, 12:49 PM

## 2012-05-24 NOTE — Assessment & Plan Note (Signed)
Long-term problem for patient. No symptoms of bipolar disorder. Patient states she would like to think about counseling for now. Open to trial of Effexor.  Start low dose, may need to increase.   FU in 2 weeks to assess for any changes or improvement, FU sooner if worsening. Denies any suicidal or homicidal ideations.

## 2012-05-24 NOTE — Progress Notes (Signed)
  Subjective:    Patient ID: Kaylee Spencer, female    DOB: Apr 29, 1981, 31 y.o.   MRN: [id number]  HPI  1.  Migraines:  Long-time problem for patient. She has been on Imitrex and Topamax in the past without any help. She was seen here about a month ago and seen by another physician and started on propranolol and trazodone. She states that these acutely worsened her depression. She also states they did not help with her migraines. She describes migraines 3-4 times a week. They're usually unilateral in the left temporal parietal side. She suffers from photophobia and nausea with some vomiting during the migraines. She takes Aleve and ibuprofen but is worried about the amount of medication she takes she takes these almost every single day. No changes in vision.  2.  DUB: Vaginal bleeding present since February. Acutely worsened last night. She began passing large "baseball-sized clots". She was seen in [facility]. She was started on 10 days of Provera. She has an ultrasound scheduled in 2 days. No lightheaded symptoms. No chest pain or trouble breathing to  3.  Depression:  Long-time problem per patient. She was treated on Zoloft and lithium as a teenager. She did have one week stay in a psychiatric hospital for depression at that time. She's been on Topamax for migraine to states this actually worsened her depression. She states the same thing about trazodone and propanolol. Please see above for migraines. She expressed interest in counseling.  She is also open to medication if "it will actually help." She denies any history of manic episodes or symptoms. Depression has been worse for least past year. She expresses and hernia, increased sleep, increased eating. She does sometimes feel guilty over these feelings.  The following portions of the patient's history were reviewed and updated as appropriate: allergies, current medications, past medical history, family and social history, and problem list.  Patient is  a nonsmoker.   Review of Systems See HPI above for review of systems.       Objective:   Physical Exam Gen:  Alert, cooperative patient who appears stated age in no acute distress.  Vital signs reviewed. HEENT:  [place]/AT.  EOMI, PERRL.  Fundoscopy WNL with clear cup-to-disc margins.  MMM, tonsils non-erythematous, non-edematous.  External ears WNL, Bilateral TM's normal without retraction, redness or bulging.  Neck:  Supple Cardiac:  Regular rate and rhythm without murmur auscultated.  Good S1/S2. Pulm:  Clear to auscultation bilaterally with good air movement.  No wheezes or rales noted.   Psych:  Mildly depressed appearing. Not anxious appearing. Linear and coherent thought process.        Assessment & Plan:

## 2012-05-24 NOTE — Patient Instructions (Signed)
The name of the new medicine is called Effexor. It is in a class called SNRI's.  Tramadol for pain relief.

## 2012-05-24 NOTE — MAU Provider Note (Signed)
Medical Screening exam and patient care preformed by advanced practice provider.  Agree with the above management.  

## 2012-05-24 NOTE — MAU Note (Signed)
Pt in c/o irregular bleeding since February.  Became very heavy yesterday, changing tampons every 30 minutes, and using 2 at a time.  Reports passing baseball sized clots.  Denies any hx of fibroids.  Reports lower abdominal cramping.

## 2012-05-24 NOTE — Assessment & Plan Note (Signed)
Long-term problem.   She desires referral to Neurology due to length of symptoms and lack of any progression. I believe this is reasonable.   Plan Neurology referral today, Tramadol for relief in meantime.

## 2012-05-24 NOTE — MAU Note (Signed)
Patient states she has had vaginal bleeding since February almost every day. Had stopped for short periods. Started bleeding heavier yesterday and today passing multiple clots. Feels cold and some dizziness. Has had to change tampons at times every 15 minutes. Has a history of irregular bleeding.

## 2012-05-31 ENCOUNTER — Ambulatory Visit (HOSPITAL_COMMUNITY)
Admit: 2012-05-31 | Discharge: 2012-05-31 | Disposition: A | Discharge status: Home / Self Care | Payer: PRIVATE HEALTH INSURANCE | Attending: Family | Admitting: Family

## 2012-05-31 ENCOUNTER — Telehealth: Payer: Self-pay | Admitting: Family Medicine

## 2012-05-31 DIAGNOSIS — N949 Unspecified condition associated with female genital organs and menstrual cycle: Secondary | ICD-10-CM | POA: Insufficient documentation

## 2012-05-31 DIAGNOSIS — N938 Other specified abnormal uterine and vaginal bleeding: Secondary | ICD-10-CM | POA: Insufficient documentation

## 2012-05-31 NOTE — Telephone Encounter (Signed)
Patient is calling for results of ultrasound.

## 2012-06-04 ENCOUNTER — Telehealth: Payer: Self-pay | Admitting: Family Medicine

## 2012-06-04 NOTE — Telephone Encounter (Signed)
Is asking for results of her [place] and is still cramping and bleeding a lot and needs to know what to do.

## 2012-06-05 NOTE — Telephone Encounter (Signed)
Called and left message on mobile voicemail.  Called home and left message with mother to call clinic.

## 2012-06-05 NOTE — Telephone Encounter (Signed)
Spoke with patient.  Now with fatigue and some lightheadedness.  Bleeding has lightened but persisted since starting Provera.  No chest pain, SOB.  Some abdomen cramping.  TOld her to call back so she can be scheduled to be seen tomorrow at 2 or 2:30, it's ok to double-book her.

## 2012-06-06 ENCOUNTER — Ambulatory Visit (INDEPENDENT_AMBULATORY_CARE_PROVIDER_SITE_OTHER): Payer: PRIVATE HEALTH INSURANCE | Admitting: Family Medicine

## 2012-06-06 ENCOUNTER — Encounter: Payer: Self-pay | Admitting: Family Medicine

## 2012-06-06 VITALS — BP 154/94 | HR 105 | Temp 98.5°F | Ht 65.0 in | Wt 201.2 lb

## 2012-06-06 DIAGNOSIS — R5383 Other fatigue: Secondary | ICD-10-CM

## 2012-06-06 DIAGNOSIS — N920 Excessive and frequent menstruation with regular cycle: Secondary | ICD-10-CM

## 2012-06-06 DIAGNOSIS — N949 Unspecified condition associated with female genital organs and menstrual cycle: Secondary | ICD-10-CM

## 2012-06-06 DIAGNOSIS — R5381 Other malaise: Secondary | ICD-10-CM

## 2012-06-06 DIAGNOSIS — F419 Anxiety disorder, unspecified: Secondary | ICD-10-CM

## 2012-06-06 DIAGNOSIS — F411 Generalized anxiety disorder: Secondary | ICD-10-CM

## 2012-06-06 DIAGNOSIS — N938 Other specified abnormal uterine and vaginal bleeding: Secondary | ICD-10-CM

## 2012-06-06 MED ORDER — IBUPROFEN 800 MG PO TABS: 800.0000 mg | ORAL_TABLET | Freq: Three times a day (TID) | ORAL | Status: AC | PRN

## 2012-06-06 NOTE — Progress Notes (Signed)
  Subjective:    Patient ID: Kaylee Spencer, female    DOB: 10-08-1981, 31 y.o.   MRN: [id number]  HPI 1.  DUB:  Still with some minor bleeding. Cramping persists.  Using 2-3 pads a day.  No further clots passed.  Did have some lightheadedness yesterday.  No falls or syncopal episodes.  Has not started iron.  Finished with Provera course.  2.  Anxiety:  Has not started Effexor, worried about side effects.  Plans to start now to help with her anxiety.  No SI/HI.   Review of Systems See HPI above for review of systems.       Objective:   Physical Exam  Gen:  Alert, cooperative patient who appears stated age in no acute distress.  Vital signs reviewed. Abdomen:  Nontender Cardiac:  Regular rate and rhythm without murmur auscultated.  Good S1/S2. Psych:  Somewhat depressed appearing, not anxious      Assessment & Plan:

## 2012-06-06 NOTE — Patient Instructions (Addendum)
Ibuprofen 800 mg three times a day for a few days and then back off to as needed. Let me see you 2 weeks after the Effexor.   The iron is fine to take.

## 2012-06-07 ENCOUNTER — Telehealth: Payer: Self-pay | Admitting: Family Medicine

## 2012-06-07 LAB — CBC
Hemoglobin: 11.1 g/dL — ABNORMAL LOW (ref 12.0–15.0)
MCH: 28 pg (ref 26.0–34.0)
MCV: 83.6 fL (ref 78.0–100.0)
RBC: 3.96 MIL/uL (ref 3.87–5.11)

## 2012-06-07 NOTE — Telephone Encounter (Signed)
Had to leave message.  When she calls back, can nursing staff discuss anemia with her?  I'm not in clinic this PM.  We talked about iron at her appt yesterday, I think twice daily iron would be good for the next 6 weeks.  We can recheck her iron levels at that time.  Thanks,

## 2012-06-08 NOTE — Assessment & Plan Note (Signed)
FU in 2 weeks after initiation of Effexor to see how she's doing

## 2012-06-08 NOTE — Assessment & Plan Note (Signed)
Has finished Provera Recommended 800 mg Ibuprofen to help with cramping and residual bleeding, scheduled for next several days then as needed. May need another course of Provera versus referral back to OB/GYN as this has been long-term problem for patient

## 2012-06-11 ENCOUNTER — Telehealth: Payer: Self-pay | Admitting: Family Medicine

## 2012-06-11 NOTE — Telephone Encounter (Signed)
Patient is returning call regarding message that was left from Dr. Gwendolyn Grant and in that message and encounter, he requested that a nurse speak to the patient about anemia.

## 2012-06-12 NOTE — Telephone Encounter (Signed)
Patient informed of message from MD (see previus phone note), expressed understanding.

## 2013-04-14 ENCOUNTER — Other Ambulatory Visit: Payer: Self-pay | Admitting: Family Medicine

## 2013-10-03 ENCOUNTER — Ambulatory Visit (INDEPENDENT_AMBULATORY_CARE_PROVIDER_SITE_OTHER): Payer: PRIVATE HEALTH INSURANCE | Admitting: Family Medicine

## 2013-10-03 ENCOUNTER — Encounter: Payer: Self-pay | Admitting: Family Medicine

## 2013-10-03 VITALS — BP 138/98 | HR 92 | Temp 98.3°F | Ht 64.0 in | Wt 180.5 lb

## 2013-10-03 DIAGNOSIS — I10 Essential (primary) hypertension: Secondary | ICD-10-CM

## 2013-10-03 DIAGNOSIS — R110 Nausea: Secondary | ICD-10-CM

## 2013-10-03 DIAGNOSIS — R109 Unspecified abdominal pain: Secondary | ICD-10-CM

## 2013-10-03 DIAGNOSIS — R319 Hematuria, unspecified: Secondary | ICD-10-CM

## 2013-10-03 DIAGNOSIS — R351 Nocturia: Secondary | ICD-10-CM

## 2013-10-03 LAB — CBC
Platelets: 337 10*3/uL (ref 150–400)
RBC: 4.48 MIL/uL (ref 3.87–5.11)
RDW: 14.5 % (ref 11.5–15.5)
WBC: 7.7 10*3/uL (ref 4.0–10.5)

## 2013-10-03 LAB — COMPREHENSIVE METABOLIC PANEL
AST: 16 U/L (ref 0–37)
Alkaline Phosphatase: 70 U/L (ref 39–117)
BUN: 11 mg/dL (ref 6–23)
Calcium: 9.9 mg/dL (ref 8.4–10.5)
Creat: 0.83 mg/dL (ref 0.50–1.10)
Potassium: 4.2 mEq/L (ref 3.5–5.3)
Total Bilirubin: 0.5 mg/dL (ref 0.3–1.2)
Total Protein: 7.8 g/dL (ref 6.0–8.3)

## 2013-10-03 LAB — POCT URINALYSIS DIPSTICK
Bilirubin, UA: NEGATIVE
Glucose, UA: NEGATIVE
Nitrite, UA: NEGATIVE

## 2013-10-03 LAB — POCT UA - MICROSCOPIC ONLY

## 2013-10-03 LAB — GLUCOSE, CAPILLARY: Glucose-Capillary: 94 mg/dL (ref 70–99)

## 2013-10-03 LAB — POCT HEMOGLOBIN: Hemoglobin: 12.7 g/dL (ref 12.2–16.2)

## 2013-10-03 MED ORDER — ONDANSETRON 4 MG PO TBDP: 4.0000 mg | ORAL_TABLET | Freq: Three times a day (TID) | ORAL | Status: DC | PRN

## 2013-10-03 MED ORDER — CEPHALEXIN 500 MG PO CAPS: 500.0000 mg | ORAL_CAPSULE | Freq: Four times a day (QID) | ORAL | Status: DC

## 2013-10-03 NOTE — Progress Notes (Signed)
Subjective:    Kaylee Spencer is a 32 y.o. female who presents to [facility] today for "not feeling like herself":  1.  Patient presents with several vague complaints:  Lower abdominal pain, nausea without vomiting, and malaise for several days.  Has been eating and drinking well.  No dysuria.  Endorses nocturia multiple times each night, which she says is normal for her for about a year. Reports drinking mostly water and unsweetened tea.   No caffeine intake except for green tea.  Also multiple episodes of frequency during the day as well.  No fevers or chills.    Denies any mood disorder symptoms.  Not using anything for contraception except condoms.  Last sexual intercourse was late August, before LMP.    LMP:  Toward end of August.  Last day was Sept 2.      The following portions of the patient's history were reviewed and updated as appropriate: allergies, current medications, past medical history, family and social history, and problem list. Patient is a nonsmoker.    PMH reviewed.  Past Medical History  Diagnosis Date  . Hypertension   . EXBMWUXL(244.0)    Past Surgical History  Procedure Laterality Date  . Boil removal    . Dilation and curettage of uterus      Medications reviewed. Current Outpatient Prescriptions  Medication Sig Dispense Refill  . medroxyPROGESTERone (PROVERA) 10 MG tablet Take 1 tablet (10 mg total) by mouth daily.  10 tablet  0  . naproxen sodium (ANAPROX) 220 MG tablet Take 220 mg by mouth 2 (two) times daily with a meal. For migraines.      Marland Kitchen OVER THE COUNTER MEDICATION Take 2 tablets by mouth daily as needed. Sleep aid medication.      . traMADol (ULTRAM) 50 MG tablet TAKE 1 TABLET BY MOUTH EVERY 8 HOURS AS NEEDED FOR PAIN  30 tablet  0  . traZODone (DESYREL) 50 MG tablet Take 1 tablet (50 mg total) by mouth at bedtime.  30 tablet  3  . venlafaxine XR (EFFEXOR-XR) 37.5 MG 24 hr capsule Take 1 capsule (37.5 mg total) by mouth daily.  30 capsule  0   No  current facility-administered medications for this visit.    ROS as above otherwise neg.  No chest pain, palpitations, SOB, Fever, Chills, Abd pain, N/V/D.   Objective:   Physical Exam BP 138/98  Pulse 92  Temp(Src) 98.3 F (36.8 C) (Oral)  Ht 5\' 4"  (1.626 m)  Wt 180 lb 8 oz (81.874 kg)  BMI 30.97 kg/m2 Gen:  Alert, cooperative patient who appears stated age in no acute distress.  Vital signs reviewed. HEENT: EOMI,  MMM Cardiac:  Regular rate and rhythm without murmur auscultated.   Pulm:  Clear to auscultation bilaterally with good air movement.   Abd:  Soft/nondistended/minimally tender supra-pubic.  No guarding or rebound.    Exts: Non edematous BL  LE, warm and well perfused.  Psych:  Linear and coherent thought process, not depressed or anxious appearing.   No results found for this or any previous visit (from the past 72 hour(s)).

## 2013-10-03 NOTE — Assessment & Plan Note (Signed)
Not currently with menstruating.   Will treat as UTI based on blood in urine and her symptoms.   CBGs, Hgb good here. Will call with lab results and see how she's doing once results are in -- checking CMET, TSH. CBC for history of anemia.

## 2013-10-03 NOTE — Patient Instructions (Signed)
Blood work today.  We'll call with the results of the bloodwork.    Looks like you have a UTI.  Zofran under your tongue if you have nausea.

## 2013-10-07 ENCOUNTER — Telehealth: Payer: Self-pay | Admitting: Family Medicine

## 2013-10-07 NOTE — Telephone Encounter (Signed)
Called and had to leave message regarding patient's labwork.  Everything looked good.  Was also calling to see how she was feeling since being treated for UTI.

## 2013-10-10 NOTE — Telephone Encounter (Signed)
Would you guys mind calling and relaying the message regarding normal labs and make sure she's feeling better?  Thanks!  Trey Paula

## 2013-10-11 NOTE — Telephone Encounter (Signed)
LVM for patient to call back. ?

## 2013-10-24 ENCOUNTER — Other Ambulatory Visit: Payer: Self-pay

## 2014-10-20 ENCOUNTER — Encounter: Payer: Self-pay | Admitting: Family Medicine

## 2016-09-21 ENCOUNTER — Ambulatory Visit: Payer: Self-pay | Admitting: Obstetrics and Gynecology

## 2017-04-22 ENCOUNTER — Inpatient Hospital Stay (HOSPITAL_COMMUNITY)
Admission: AD | Admit: 2017-04-22 | Discharge: 2017-04-22 | Disposition: A | Discharge status: Home / Self Care | Payer: Commercial Managed Care - PPO | Source: Ambulatory Visit | Attending: Obstetrics & Gynecology | Admitting: Obstetrics & Gynecology

## 2017-04-22 ENCOUNTER — Encounter (HOSPITAL_COMMUNITY): Payer: Self-pay | Admitting: *Deleted

## 2017-04-22 DIAGNOSIS — R109 Unspecified abdominal pain: Secondary | ICD-10-CM | POA: Diagnosis present

## 2017-04-22 DIAGNOSIS — R1030 Lower abdominal pain, unspecified: Secondary | ICD-10-CM | POA: Insufficient documentation

## 2017-04-22 DIAGNOSIS — B9689 Other specified bacterial agents as the cause of diseases classified elsewhere: Secondary | ICD-10-CM | POA: Insufficient documentation

## 2017-04-22 DIAGNOSIS — N760 Acute vaginitis: Secondary | ICD-10-CM | POA: Insufficient documentation

## 2017-04-22 DIAGNOSIS — Z87891 Personal history of nicotine dependence: Secondary | ICD-10-CM | POA: Diagnosis not present

## 2017-04-22 DIAGNOSIS — N926 Irregular menstruation, unspecified: Secondary | ICD-10-CM | POA: Insufficient documentation

## 2017-04-22 DIAGNOSIS — Z3202 Encounter for pregnancy test, result negative: Secondary | ICD-10-CM | POA: Diagnosis not present

## 2017-04-22 DIAGNOSIS — Z8249 Family history of ischemic heart disease and other diseases of the circulatory system: Secondary | ICD-10-CM | POA: Diagnosis not present

## 2017-04-22 DIAGNOSIS — K5900 Constipation, unspecified: Secondary | ICD-10-CM | POA: Insufficient documentation

## 2017-04-22 DIAGNOSIS — K5901 Slow transit constipation: Secondary | ICD-10-CM

## 2017-04-22 LAB — URINALYSIS, ROUTINE W REFLEX MICROSCOPIC
Bilirubin Urine: NEGATIVE
GLUCOSE, UA: NEGATIVE mg/dL
Ketones, ur: NEGATIVE mg/dL
Leukocytes, UA: NEGATIVE
NITRITE: NEGATIVE
PROTEIN: NEGATIVE mg/dL
SPECIFIC GRAVITY, URINE: 1.013 (ref 1.005–1.030)
pH: 5 (ref 5.0–8.0)

## 2017-04-22 LAB — WET PREP, GENITAL
SPERM: NONE SEEN
Trich, Wet Prep: NONE SEEN
Yeast Wet Prep HPF POC: NONE SEEN

## 2017-04-22 LAB — POCT PREGNANCY, URINE: PREG TEST UR: NEGATIVE

## 2017-04-22 MED ORDER — METRONIDAZOLE 500 MG PO TABS: 500.0000 mg | ORAL_TABLET | Freq: Two times a day (BID) | ORAL | 0 refills | Status: DC

## 2017-04-22 MED ORDER — KETOROLAC TROMETHAMINE 60 MG/2ML IM SOLN
60.0000 mg | Freq: Once | INTRAMUSCULAR | Status: AC
  Administered 2017-04-22: 60 mg via INTRAMUSCULAR
  Filled 2017-04-22: qty 2

## 2017-04-22 MED ORDER — DOCUSATE SODIUM 250 MG PO CAPS: 250.0000 mg | ORAL_CAPSULE | Freq: Every day | ORAL | 0 refills | Status: DC

## 2017-04-22 MED ORDER — POLYETHYLENE GLYCOL 3350 17 GM/SCOOP PO POWD
ORAL | 0 refills | Status: DC
Start: 2017-04-22 — End: 2017-06-16

## 2017-04-22 NOTE — MAU Provider Note (Signed)
History     CSN: [id number]  Arrival date and time: 04/22/17 1148   First Provider Initiated Contact with Patient 04/22/17 1215      Chief Complaint  Patient presents with  . Abdominal Cramping  . Amenorrhea  . Vaginal Bleeding  . Emesis   HPI Ms. Kaylee Spencer is a 36 y.o. G1P0010 who presents to MAU today with complaint of abdominal pain, spotting and late menses. The patient states that she has a history of irregular periods, but they had been more regular lately. She is 6 days late for this period. She has had lower abdominal and back pain x 1 week. She rates pain at 8/10 now. She took Aleve yesterday with minimal relief. She denies abnormal discharge, UTI symptoms, fever or diarrhea. She had one episode of N/V earlier this week. She does endorse constipation. She has not tried anything to relief this aside from increasing her dietary fiber.   OB History    Gravida Para Term Preterm AB Living   1 0 0 0 1 0   SAB TAB Ectopic Multiple Live Births   1 0 0 0        Past Medical History:  Diagnosis Date  . [id number](478.2Headache(784.0)     Past Surgical History:  Procedure Laterality Date  . boil removal    . DILATION AND CURETTAGE OF UTERUS      Family History  Problem Relation Age of Onset  . Hypertension Mother   . Anesthesia problems Neg Hx     Social History  Substance Use Topics  . Smoking status: Former Smoker    Quit date: 04/22/1998  . Smokeless tobacco: Never Used  . Alcohol use No    Allergies: No Known Allergies  No prescriptions prior to admission.    Review of Systems  Constitutional: Negative for fever.  Gastrointestinal: Positive for abdominal pain, constipation and nausea. Negative for diarrhea and vomiting.  Genitourinary: Positive for pelvic pain and vaginal bleeding. Negative for dysuria, flank pain, frequency, urgency and vaginal discharge.  Musculoskeletal: Positive for back pain.   Physical Exam   Blood pressure (!) 141/99, pulse 79,  temperature 98.1 F (36.7 C), temperature source Oral, resp. rate 17, height 5\' 5"  (1.651 m), weight 188 lb 8 oz (85.5 kg), last menstrual period 03/17/2017, SpO2 100 %.  Physical Exam  Nursing note and vitals reviewed. Constitutional: She is oriented to person, place, and time. She appears well-developed and well-nourished. No distress.  HENT:  Head: Normocephalic and atraumatic.  Cardiovascular: Normal rate.   Respiratory: Effort normal.  GI: Soft. She exhibits no distension and no mass. There is tenderness (mild tenderness to palpation of the lower abdomen bilaterally). There is no rebound, no guarding and no CVA tenderness.  Genitourinary: Uterus is not enlarged and not tender. Cervix exhibits friability (mild). Cervix exhibits no motion tenderness and no discharge. Right adnexum displays no mass and no tenderness. Left adnexum displays tenderness (mild). Left adnexum displays no mass. There is bleeding (scant) in the vagina. No vaginal discharge found.  Musculoskeletal:       Thoracic back: She exhibits no tenderness.       Lumbar back: She exhibits no tenderness.  Neurological: She is alert and oriented to person, place, and time.  Skin: Skin is warm and dry. No erythema.  Psychiatric: She has a normal mood and affect.    Results for orders placed or performed during the hospital encounter of 04/22/17 (from the past 24 hour(s))  Urinalysis, Routine  w reflex microscopic     Status: Abnormal   Collection Time: 04/22/17 12:00 PM  Result Value Ref Range   Color, Urine YELLOW YELLOW   APPearance CLEAR CLEAR   Specific Gravity, Urine 1.013 1.005 - 1.030   pH 5.0 5.0 - 8.0   Glucose, UA NEGATIVE NEGATIVE mg/dL   Hgb urine dipstick MODERATE (A) NEGATIVE   Bilirubin Urine NEGATIVE NEGATIVE   Ketones, ur NEGATIVE NEGATIVE mg/dL   Protein, ur NEGATIVE NEGATIVE mg/dL   Nitrite NEGATIVE NEGATIVE   Leukocytes, UA NEGATIVE NEGATIVE   RBC / HPF 0-5 0 - 5 RBC/hpf   WBC, UA 0-5 0 - 5 WBC/hpf    Bacteria, UA RARE (A) NONE SEEN   Squamous Epithelial / LPF 0-5 (A) NONE SEEN   Mucous PRESENT   Pregnancy, urine POC     Status: None   Collection Time: 04/22/17 12:12 PM  Result Value Ref Range   Preg Test, Ur NEGATIVE NEGATIVE  Wet prep, genital     Status: Abnormal   Collection Time: 04/22/17 12:22 PM  Result Value Ref Range   Yeast Wet Prep HPF POC NONE SEEN NONE SEEN   Trich, Wet Prep NONE SEEN NONE SEEN   Clue Cells Wet Prep HPF POC PRESENT (A) NONE SEEN   WBC, Wet Prep HPF POC FEW (A) NONE SEEN   Sperm NONE SEEN     MAU Course  Procedures None  MDM UPT - negative UA, wet prep and GC/Chlamydia today  60 mg Toradol given for pain. Patient states some improvement in pain prior to discharge.  Discussed results with patient. Patient is concerned about her fertility. Advised to try OTC ovulation tests and monitor exact cycles. If cycles remain irregular and she does not conceive she will need to have [facility] or [facility] refer her to Dr. April Manson for fertility consultation.  Assessment and Plan  A: Negative pregnancy test Abdominal pain Bacterial Vaginosis Irregular menses Constipation   P: Discharge home Rx for Flagyl, Miralx and Colace given to patient  Ibuprofen PRN for pain advised  Bleeding precautions discussed Patient advised to follow-up with [facility] or [facility] if symptoms persist Patient may return to MAU as needed or if her condition were to change or worsen   Marny Lowenstein, PA-C  04/22/2017, 3:56 PM

## 2017-04-22 NOTE — Discharge Instructions (Signed)
Bacterial Vaginosis °Bacterial vaginosis is an infection of the vagina. It happens when too many germs (bacteria) grow in the vagina. This infection puts you at risk for infections from sex (STIs). Treating this infection can lower your risk for some STIs. You should also treat this if you are pregnant. It can cause your baby to be born early. °Follow these instructions at home: °Medicines  °· Take over-the-counter and prescription medicines only as told by your doctor. °· Take or use your antibiotic medicine as told by your doctor. Do not stop taking or using it even if you start to feel better. °General instructions  °· If you your sexual partner is a woman, tell her that you have this infection. She needs to get treatment if she has symptoms. If you have a female partner, he does not need to be treated. °· During treatment: °¨ Avoid sex. °¨ Do not douche. °¨ Avoid alcohol as told. °¨ Avoid breastfeeding as told. °· Drink enough fluid to keep your pee (urine) clear or pale yellow. °· Keep your vagina and butt (rectum) clean. °¨ Wash the area with warm water every day. °¨ Wipe from front to back after you use the toilet. °· Keep all follow-up visits as told by your doctor. This is important. °Preventing this condition  °· Do not douche. °· Use only warm water to wash around your vagina. °· Use protection when you have sex. This includes: °¨ Latex condoms. °¨ Dental dams. °· Limit how many people you have sex with. It is best to only have sex with the same person (be monogamous). °· Get tested for STIs. Have your partner get tested. °· Wear underwear that is cotton or lined with cotton. °· Avoid tight pants and pantyhose. This is most important in summer. °· Do not use any products that have nicotine or tobacco in them. These include cigarettes and e-cigarettes. If you need help quitting, ask your doctor. °· Do not use illegal drugs. °· Limit how much alcohol you drink. °Contact a doctor if: °· Your symptoms do not  get better, even after you are treated. °· You have more discharge or pain when you pee (urinate). °· You have a fever. °· You have pain in your belly (abdomen). °· You have pain with sex. °· Your bleed from your vagina between periods. °Summary °· This infection happens when too many germs (bacteria) grow in the vagina. °· Treating this condition can lower your risk for some infections from sex (STIs). °· You should also treat this if you are pregnant. It can cause early (premature) birth. °· Do not stop taking or using your antibiotic medicine even if you start to feel better. °This information is not intended to replace advice given to you by your health care provider. Make sure you discuss any questions you have with your health care provider. °Document Released: 09/13/2008 Document Revised: 08/20/2016 Document Reviewed: 08/20/2016 °Elsevier Interactive Patient Education © 2017 Elsevier Inc. ° °Constipation, Adult °Constipation is when a person: °· Poops (has a bowel movement) fewer times in a week than normal. °· Has a hard time pooping. °· Has poop that is dry, hard, or bigger than normal. °Follow these instructions at home: °Eating and drinking  ° °· Eat foods that have a lot of fiber, such as: °¨ Fresh fruits and vegetables. °¨ Whole grains. °¨ Beans. °· Eat less of foods that are high in fat, low in fiber, or overly processed, such as: °¨ French fries. °¨ Hamburgers. °¨   Cookies.  Candy.  Soda.  Drink enough fluid to keep your pee (urine) clear or pale yellow. General instructions   Exercise regularly or as told by your doctor.  Go to the restroom when you feel like you need to poop. Do not hold it in.  Take over-the-counter and prescription medicines only as told by your doctor. These include any fiber supplements.  Do pelvic floor retraining exercises, such as:  Doing deep breathing while relaxing your lower belly (abdomen).  Relaxing your pelvic floor while pooping.  Watch your  condition for any changes.  Keep all follow-up visits as told by your doctor. This is important. Contact a doctor if:  You have pain that gets worse.  You have a fever.  You have not pooped for 4 days.  You throw up (vomit).  You are not hungry.  You lose weight.  You are bleeding from the anus.  You have thin, pencil-like poop (stool). Get help right away if:  You have a fever, and your symptoms suddenly get worse.  You leak poop or have blood in your poop.  Your belly feels hard or bigger than normal (is bloated).  You have very bad belly pain.  You feel dizzy or you faint. This information is not intended to replace advice given to you by your health care provider. Make sure you discuss any questions you have with your health care provider. Document Released: 05/23/2008 Document Revised: 06/24/2016 Document Reviewed: 05/25/2016 Elsevier Interactive Patient Education  2017 [employer]

## 2017-04-22 NOTE — MAU Note (Signed)
Patient presents with lower abdominal pain 8/10 for the past week, light spotting and small blood clot noted when using the bathroom yesterday and today.  Denies any other vaginal discharge or pain.  Also c/o bilateral flank pain and a feeling of abdominal "pressure" after voiding.  Denies diarrhea and states she has been constipated.  Last BM was today.  LMP 03/17/17.  Took negative HPT 4 days ago. Pt c/o nausea and has vomited x1 in past week.

## 2017-04-24 LAB — GC/CHLAMYDIA PROBE AMP (~~LOC~~) NOT AT ARMC
CHLAMYDIA, DNA PROBE: NEGATIVE
Neisseria Gonorrhea: NEGATIVE

## 2017-06-16 ENCOUNTER — Encounter: Payer: Self-pay | Admitting: Obstetrics and Gynecology

## 2017-06-16 ENCOUNTER — Ambulatory Visit (INDEPENDENT_AMBULATORY_CARE_PROVIDER_SITE_OTHER): Payer: Commercial Managed Care - PPO | Admitting: Family Medicine

## 2017-06-16 VITALS — BP 122/80 | HR 62 | Temp 98.8°F | Ht 64.0 in | Wt 182.0 lb

## 2017-06-16 DIAGNOSIS — N938 Other specified abnormal uterine and vaginal bleeding: Secondary | ICD-10-CM | POA: Diagnosis not present

## 2017-06-16 DIAGNOSIS — F32A Depression, unspecified: Secondary | ICD-10-CM

## 2017-06-16 DIAGNOSIS — Z7689 Persons encountering health services in other specified circumstances: Secondary | ICD-10-CM | POA: Diagnosis not present

## 2017-06-16 DIAGNOSIS — F329 Major depressive disorder, single episode, unspecified: Secondary | ICD-10-CM

## 2017-06-16 DIAGNOSIS — F419 Anxiety disorder, unspecified: Secondary | ICD-10-CM

## 2017-06-16 NOTE — Patient Instructions (Signed)
Glad you were able to reestablish care with us! Please call and schedule an appointment for follow up on your anxiety with your PCP.   We will send a referral to the infertility clinic.   Dysfunctional Uterine Bleeding Dysfunctional uterine bleeding is abnormal bleeding from the uterus. Dysfunctional uterine bleeding includes:  A period that comes earlier or later than usual.  A period that is lighter, heavier, or has blood clots.  Bleeding between periods.  Skipping one or more periods.  Bleeding after sexual intercourse.  Bleeding after menopause.  Follow these instructions at home: Pay attention to any changes in your symptoms. Follow these instructions to help with your condition: Eating and drinking  Eat well-balanced meals. Include foods that are high in iron, such as liver, meat, shellfish, green leafy vegetables, and eggs.  If you become constipated: ? Drink plenty of water. ? Eat fruits and vegetables that are high in water and fiber, such as spinach, carrots, raspberries, apples, and mango. Medicines  Take over-the-counter and prescription medicines only as told by your health care provider.  Do not change medicines without talking with your health care provider.  Aspirin or medicines that contain aspirin may make the bleeding worse. Do not take those medicines: ? During the week before your period. ? During your period.  If you were prescribed iron pills, take them as told by your health care provider. Iron pills help to replace iron that your body loses because of this condition. Activity  If you need to change your sanitary pad or tampon more than one time every 2 hours: ? Lie in bed with your feet raised (elevated). ? Place a cold pack on your lower abdomen. ? Rest as much as possible until the bleeding stops or slows down.  Do not try to lose weight until the bleeding has stopped and your blood iron level is back to normal. Other Instructions  For two  months, write down: ? When your period starts. ? When your period ends. ? When any abnormal bleeding occurs. ? What problems you notice.  Keep all follow up visits as told by your health care provider. This is important. Contact a health care provider if:  You get light-headed or weak.  You have nausea and vomiting.  You cannot eat or drink without vomiting.  You feel dizzy or have diarrhea while you are taking medicines.  You are taking birth control pills or hormones, and you want to change them or stop taking them. Get help right away if:  You develop a fever or chills.  You need to change your sanitary pad or tampon more than one time per hour.  Your bleeding becomes heavier, or your flow contains clots more often.  You develop pain in your abdomen.  You lose consciousness.  You develop a rash. This information is not intended to replace advice given to you by your health care provider. Make sure you discuss any questions you have with your health care provider. Document Released: 12/02/2000 Document Revised: 05/12/2016 Document Reviewed: 03/02/2015 Elsevier Interactive Patient Education  [employer]2018 [employer] Inc.

## 2017-06-16 NOTE — Assessment & Plan Note (Signed)
Patient will call and schedule a follow up appointment with us for an assessment for anxiety with her PCP.

## 2017-06-16 NOTE — Assessment & Plan Note (Signed)
Referral to infertility clinic for work up. Advised patient to return if there are any significant changes, increased bleeding or pain with menstrual cycle.

## 2017-06-16 NOTE — Progress Notes (Signed)
     Subjective: Chief Complaint  Patient presents with  . New Patient (Initial Visit)     HPI: Kaylee Spencer is a 36 y.o. presenting to clinic today to discuss the following:  # Concerns about infertility. Patient states she has been attempting to get pregnant with her boyfriend for over one year. She has been using an ovulation app on her phone. She took prenatal vitamins but discontinued due to upset stomach. Has been pregnant once before but had early miscarriage. Would like to get pregnant and wants referral to fertility specialist. Denies abdominal pain, vaginal discharge, fevers.  # DUB. She has had abnormal periods for the past 4 months that have been lighter than normal and shorter in duration. She states for the past 4 months she has had lighter periods lasting 2-3 days and missed on period in the month of April. She denies any pain during menstruation but does have mild cramping. She denies any history of early menopause in the family. Prior periods were 7 days in duration and would occur every 30 days.   #Anxiety. Patient states she gets panic attacks and has generalized anxiety most days. She has tried a beta blocker in the past but discontinued use due to not liking how it made her feel.  Health Maintenance: pap is due. Declined today  ROS noted in HPI.   Past Medical, Surgical, Social, and Family History Reviewed & Updated per EMR.   Pertinent Historical Findings include:   History  Smoking Status  . Former Smoker  . Quit date: 04/22/1998  Smokeless Tobacco  . Never Used    Objective: BP 122/80   Pulse 62   Temp 98.8 F (37.1 C) (Oral)   Ht 5\' 4"  (1.626 m)   Wt 182 lb (82.6 kg)   LMP 05/20/2017   BMI 31.24 kg/m  Vitals and nursing notes reviewed  Physical Exam  Constitutional: She is oriented to person, place, and time and well-developed, well-nourished, and in no distress.  Eyes: EOM are normal.  Neck: Normal range of motion. Neck supple. No  tracheal deviation present. No thyromegaly present.  Cardiovascular: Normal rate, regular rhythm and normal heart sounds.   Pulmonary/Chest: Effort normal and breath sounds normal. She has no wheezes. She has no rales.  Abdominal: Soft. Bowel sounds are normal. She exhibits no distension. There is no tenderness.  Musculoskeletal: Normal range of motion.  Neurological: She is alert and oriented to person, place, and time.  Skin: Skin is warm and dry.  Psychiatric: Affect normal.   No results found for this or any previous visit (from the past 72 hour(s)).  Assessment/Plan: Please see problem based Assessment and Plan PATIENT EDUCATION PROVIDED: See AVS    Health Maintainance: Pap will be schedule by patient.   Orders Placed This Encounter  Procedures  . Ambulatory referral to Gynecology    Referral Priority:   Routine    Referral Type:   Consultation    Referral Reason:   Specialty Services Required    Requested Specialty:   Gynecology    Number of Visits Requested:   1    No orders of the defined types were placed in this encounter.   Jules Schickim Amar Keenum, DO 06/16/2017, 11:34 AM PGY-1, [facility]

## 2017-06-16 NOTE — Assessment & Plan Note (Signed)
Patient will call and schedule a follow up appointment with us for further work up. She has not been on taking any medications and her depression at this time is well controlled. She denied any recent episodes of depression.

## 2017-11-28 NOTE — Progress Notes (Signed)
   [facility] Phone: [phone]   Date of Visit: 11/30/2017   HPI:  Patient presents today for a well woman exam.   Concerns today: none Periods: irregular as patient reports of history of PCOS.  Reports that since she was started on metformin about 4 months ago she has been having heavier periods with clots.  Denies any lightheadedness or dizziness.  Periods last 4-5 days.  Contraception: Patient is trying to conceive Pelvic symptoms: Denies any vaginal itching.  She noticed some brownish vaginal discharge today.   Sexual activity: Sexually active with partner STD Screening: She would like to be screened for STDs Pap smear status: Last completed in 2012 with normal cytology Exercise: Reports of regular exercise about 3 times a week.  She does yoga or Pilates. Diet: She has cut out soda and sweet drinks from her diet.  She is trying to eat a well-balanced meal Smoking: Former smoker quit about 20 years ago Alcohol: Reports of a history of alcohol dependence as she reports that she did drink  to manage with stress.  She reports that she has cut down significantly.  She currently drinks liquor a double shot on average every other day. Drugs: Denies any marijuana or cocaine use Mood: PHQ9 7, not difficult; question 9 negative. Patient not interested in medications. Will think about behavioral health therapy. Dentist: does not go to dentist  ROS:  Review of Systems  Constitutional: Negative for fever and malaise/fatigue.  HENT: Negative for congestion and sore throat.   Eyes: Negative for blurred vision.  Respiratory: Negative for cough and shortness of breath.   Cardiovascular: Negative for chest pain and palpitations.  Gastrointestinal: Negative for abdominal pain, constipation and nausea.  Genitourinary: Negative for dysuria and frequency.  Musculoskeletal: Negative for back pain and myalgias.  Skin: Negative for rash.  Neurological: Positive for  headaches.  Endo/Heme/Allergies: Negative for polydipsia. Does not bruise/bleed easily.  Psychiatric/Behavioral: Positive for depression. Negative for substance abuse and suicidal ideas. The patient does not have insomnia.        Stress    PMFSH:  Cancers in family: none   PMH: PCOS, Anxiety, Depression  PHYSICAL EXAM: BP 126/80   Pulse 66   Temp 98.5 F (36.9 C) (Oral)   Wt 174 lb (78.9 kg)   LMP 10/19/2017   BMI 29.87 kg/m  Gen: NAD, pleasant, cooperative HEENT: NCAT, PERRL, no palpable thyromegaly or anterior cervical lymphadenopathy Heart: RRR, no murmurs Lungs: CTAB, NWOB Abdomen: soft, nontender to palpation Neuro: grossly nonfocal, speech normal GU: normal appearing external genitalia without lesions. Vagina is moist with white discharge. Cervix normal in appearance. No cervical motion tenderness or tenderness on bimanual exam. No adnexal masses.   ASSESSMENT/PLAN:  # Health maintenance:  -STD screening: GC/Chlamydia, HIV, RPR. Wet prep obtained due to 1 day history of vagina discharge. Only significant for few clue cells.  -pap smear: completed today -lipid screening: reports she had lipid panel completed at infertility clinic a few months ago. ROI for this lab - Regular alcohol use: discussed stopping or cutting down alcohol more since she is trying to conceive - missed period: history of irregular periods. Will obtain urine pregnancy.   Palma HolterKanishka G Obdulio Mash, MD PGY 3 [facility]

## 2017-11-30 ENCOUNTER — Ambulatory Visit (INDEPENDENT_AMBULATORY_CARE_PROVIDER_SITE_OTHER): Payer: Commercial Managed Care - PPO | Admitting: Internal Medicine

## 2017-11-30 ENCOUNTER — Other Ambulatory Visit (HOSPITAL_COMMUNITY)
Admission: RE | Admit: 2017-11-30 | Discharge: 2017-11-30 | Disposition: A | Discharge status: Home / Self Care | Payer: Commercial Managed Care - PPO | Source: Ambulatory Visit | Attending: Family Medicine | Admitting: Family Medicine

## 2017-11-30 ENCOUNTER — Telehealth: Payer: Self-pay | Admitting: Internal Medicine

## 2017-11-30 ENCOUNTER — Other Ambulatory Visit: Payer: Self-pay

## 2017-11-30 ENCOUNTER — Encounter: Payer: Self-pay | Admitting: Internal Medicine

## 2017-11-30 VITALS — BP 126/80 | HR 66 | Temp 98.5°F | Wt 174.0 lb

## 2017-11-30 DIAGNOSIS — D069 Carcinoma in situ of cervix, unspecified: Secondary | ICD-10-CM | POA: Insufficient documentation

## 2017-11-30 DIAGNOSIS — N921 Excessive and frequent menstruation with irregular cycle: Secondary | ICD-10-CM | POA: Diagnosis not present

## 2017-11-30 DIAGNOSIS — N898 Other specified noninflammatory disorders of vagina: Secondary | ICD-10-CM | POA: Diagnosis not present

## 2017-11-30 DIAGNOSIS — R87613 High grade squamous intraepithelial lesion on cytologic smear of cervix (HGSIL): Secondary | ICD-10-CM | POA: Insufficient documentation

## 2017-11-30 DIAGNOSIS — N926 Irregular menstruation, unspecified: Secondary | ICD-10-CM

## 2017-11-30 DIAGNOSIS — Z114 Encounter for screening for human immunodeficiency virus [HIV]: Secondary | ICD-10-CM

## 2017-11-30 DIAGNOSIS — Z124 Encounter for screening for malignant neoplasm of cervix: Secondary | ICD-10-CM | POA: Diagnosis present

## 2017-11-30 DIAGNOSIS — E282 Polycystic ovarian syndrome: Secondary | ICD-10-CM | POA: Diagnosis not present

## 2017-11-30 DIAGNOSIS — Z0000 Encounter for general adult medical examination without abnormal findings: Secondary | ICD-10-CM | POA: Diagnosis not present

## 2017-11-30 DIAGNOSIS — Z113 Encounter for screening for infections with a predominantly sexual mode of transmission: Secondary | ICD-10-CM

## 2017-11-30 HISTORY — DX: High grade squamous intraepithelial lesion on cytologic smear of cervix (HGSIL): R87.613

## 2017-11-30 LAB — POCT WET PREP (WET MOUNT)
CLUE CELLS WET PREP WHIFF POC: NEGATIVE
Trichomonas Wet Prep HPF POC: ABSENT

## 2017-11-30 LAB — POCT URINE PREGNANCY: Preg Test, Ur: NEGATIVE

## 2017-11-30 NOTE — Patient Instructions (Signed)
Thank you for coming in. I will get in contact with you regarding your labs. Please call behavioral health if you are interested in this.

## 2017-11-30 NOTE — Telephone Encounter (Signed)
Attempted to call patient on mobile phone but call disconnected after ringing. Called home number. Left message with patient's mother to have patient call back clinic. I told her there is nothing urgent.

## 2017-12-01 LAB — CBC
HEMATOCRIT: 35.3 % (ref 34.0–46.6)
HEMOGLOBIN: 11.9 g/dL (ref 11.1–15.9)
MCH: 28.7 pg (ref 26.6–33.0)
MCHC: 33.7 g/dL (ref 31.5–35.7)
MCV: 85 fL (ref 79–97)
Platelets: 330 10*3/uL (ref 150–379)
RBC: 4.14 x10E6/uL (ref 3.77–5.28)
RDW: 14.6 % (ref 12.3–15.4)
WBC: 9.5 10*3/uL (ref 3.4–10.8)

## 2017-12-01 LAB — HIV ANTIBODY (ROUTINE TESTING W REFLEX): HIV Screen 4th Generation wRfx: NONREACTIVE

## 2017-12-01 LAB — RPR: RPR: NONREACTIVE

## 2017-12-01 LAB — CERVICOVAGINAL ANCILLARY ONLY
CHLAMYDIA, DNA PROBE: NEGATIVE
Neisseria Gonorrhea: NEGATIVE

## 2017-12-01 NOTE — Telephone Encounter (Signed)
Pt returned call. Unsure of message provider wanted relayed.  Pt states that you can call her back on the mobile number and you can leave a detailed message on her VM. Kaylee Spencer, Kaylee Spencer, CMA

## 2017-12-03 ENCOUNTER — Encounter: Payer: Self-pay | Admitting: Internal Medicine

## 2017-12-04 ENCOUNTER — Telehealth: Payer: Self-pay | Admitting: Internal Medicine

## 2017-12-04 MED ORDER — METRONIDAZOLE 500 MG PO TABS: 500.0000 mg | ORAL_TABLET | Freq: Two times a day (BID) | ORAL | 0 refills | Status: AC

## 2017-12-04 NOTE — Telephone Encounter (Signed)
Called patient to discuss normal labs and wet prep with BV. Flagyl 500mg  BID x 7days

## 2017-12-04 NOTE — Telephone Encounter (Signed)
Called patient and discussed. Please refer to phone note from today.

## 2017-12-05 ENCOUNTER — Telehealth: Payer: Self-pay | Admitting: Internal Medicine

## 2017-12-05 DIAGNOSIS — R87613 High grade squamous intraepithelial lesion on cytologic smear of cervix (HGSIL): Secondary | ICD-10-CM

## 2017-12-05 LAB — CYTOLOGY - PAP: HPV: DETECTED — AB

## 2017-12-05 NOTE — Telephone Encounter (Signed)
Discussed abnormal pap smear with patient. Will refer to GYN for colposcopy as there is a possibility she might need a LEEP

## 2017-12-07 ENCOUNTER — Encounter: Payer: Self-pay | Admitting: Internal Medicine

## 2017-12-07 ENCOUNTER — Other Ambulatory Visit: Payer: Self-pay | Admitting: Internal Medicine

## 2017-12-07 ENCOUNTER — Telehealth: Payer: Self-pay

## 2017-12-07 DIAGNOSIS — E282 Polycystic ovarian syndrome: Secondary | ICD-10-CM

## 2017-12-07 DIAGNOSIS — E669 Obesity, unspecified: Secondary | ICD-10-CM

## 2017-12-07 NOTE — Telephone Encounter (Signed)
Palma HolterGunadasa, Kanishka G, MD routed conversation to [facility] 16 minutes ago (1:23 PM)    Palma HolterGunadasa, Kanishka G, MD 17 minutes ago (1:21 PM)      Received lab records from [facility]. Patient did not have lipid panel done. I will order as a future order. Please call patient and ask to make a lab visit for fasting lipid panel. Thank you      Left VM informing pt  the need for a fasting lab apt for her lipid panel.

## 2017-12-07 NOTE — Progress Notes (Signed)
Received lab records from [facility]. Patient did not have lipid panel done. I will order as a future order. Please call patient and ask to make a lab visit for fasting lipid panel. Thank you

## 2017-12-14 ENCOUNTER — Other Ambulatory Visit: Payer: Commercial Managed Care - PPO

## 2017-12-25 ENCOUNTER — Telehealth: Payer: Self-pay | Admitting: Obstetrics and Gynecology

## 2017-12-25 NOTE — Telephone Encounter (Signed)
Called and left a message for patient to call back to schedule a new patient doctor referral appointment with our office for a colposcopy. [place]

## 2017-12-27 NOTE — Telephone Encounter (Signed)
Called and left a message for patient to call back to schedule a new patient doctor referral appointment with our office for a colposcopy. The patient picked up the call and stated she will need to call back in two weeks to schedule. Deferring referral until 01/10/17, per patient request.

## 2018-01-11 NOTE — Telephone Encounter (Signed)
Called and left a message for patient to call back to schedule a new patient doctor referral appointment with our office for a colposcopy. She picked up the call and said she'll call in February, probably towards the end, once she starts her menstrual cycle to schedule.

## 2018-02-05 ENCOUNTER — Other Ambulatory Visit: Payer: Self-pay | Admitting: *Deleted

## 2018-02-05 ENCOUNTER — Telehealth: Payer: Self-pay | Admitting: Obstetrics and Gynecology

## 2018-02-05 NOTE — Telephone Encounter (Signed)
Called and left a message for patient to call back to schedule a new patient doctor referral appointment with our office for a colposcopy. °

## 2018-02-06 NOTE — Telephone Encounter (Signed)
Called and left a message for patient to call back to schedule a new patient doctor referral appointment with our office for a colposcopy. °

## 2018-02-08 ENCOUNTER — Encounter: Payer: Self-pay | Admitting: Internal Medicine

## 2018-02-09 ENCOUNTER — Other Ambulatory Visit: Payer: Self-pay | Admitting: *Deleted

## 2018-02-09 NOTE — Telephone Encounter (Signed)
Greetings,  Routing back to referring office. Patient has not returned multiple calls to schedule an appoitment with our office.  Thank you,  Mervin KungStarla

## 2018-02-12 ENCOUNTER — Telehealth: Payer: Self-pay | Admitting: Internal Medicine

## 2018-02-12 NOTE — Telephone Encounter (Signed)
Attempted to call patient both home and cell nut went to voicemail. Left message to call back. I received a message from GYN referral coordinator that patient has not returned their call to schedule an appointment. This referral was made in late December. I will also send a letter.

## 2018-02-13 ENCOUNTER — Other Ambulatory Visit: Payer: Self-pay

## 2018-02-13 NOTE — Telephone Encounter (Signed)
I am unable to refill this med as this is not a chronic medication. Patient will need to come in if she is having any vaginal discomfort/discharge. Additionally, I have tried to get in contact with her to inform that the GYN referral coordinator has been trying to get in touch with her to set up an appointment for her Colposcopy. Please inform patient.

## 2018-02-15 NOTE — Telephone Encounter (Signed)
LM for patient to call back. Kaylee Spencer,CMA  

## 2018-02-16 NOTE — Telephone Encounter (Signed)
Pt returning call. Pt number [phone] Shawna OrleansMeredith B Thomsen, RN

## 2018-05-16 ENCOUNTER — Telehealth: Payer: Self-pay | Admitting: Internal Medicine

## 2018-05-16 NOTE — Telephone Encounter (Signed)
Attempted to call both numbers in chart but went to voicemail. Left message to call back.   Wanted to let her know that GYN was trying to get in touch with her to make an appointment for colposcopy

## 2018-05-28 NOTE — Progress Notes (Signed)
   [facility] Phone: [phone]   Date of Visit: 05/30/2018   HPI:  Vaginal discharge: -Reports of vaginal discharge for the past few months.  She has been having creamy yellow discharge with vaginal odor.  No itching.  No dysuria.  Reports of urinary frequency for the same duration.  She is sexually active with one partner, however she recently found out that he cheated on her.  Would like gonorrhea and Chlamydia testing.  Declined HIV or syphilis testing.  ROS: See HPI.  PMFSH: PMH: PCOS Anxiety/Depression  PHYSICAL EXAM: BP 130/80   Pulse 82   Temp 98.6 F (37 C) (Oral)   Ht 5\' 4"  (1.626 m)   Wt 185 lb (83.9 kg)   LMP 05/11/2018   SpO2 99%   BMI 31.76 kg/m  GEN: NAD CV: RRR, no murmurs, rubs, or gallops PULM: CTAB, normal effort ABD: Soft, nontender, nondistended, NABS, no organomegaly GU: Female genitalia: normal external genitalia, vulva, vagina, cervix, uterus and adnexa, no cervical motion tenderness. SKIN: No rash or cyanosis; warm and well-perfused PSYCH: Mood and affect euthymic, normal rate and volume of speech NEURO: Awake, alert, no focal deficits grossly, normal speech   ASSESSMENT/PLAN:  Vaginal discharge: Pelvic exam is unremarkable.  Wet prep is also unremarkable.  Gonorrhea chlamydia testing completed today  Urinary Frequency:  UA obtained did show trace leukocytes and moderate RBC.  Urine microscopy unremarkable. Since she has been having urinary frequency for months without any dysuria or suprapubic pain, I am not convinced that she has a urinary tract infection.   Will do urine culture. Clinic her CBGs is 127.  History of PCOS and family history of diabetes, will obtain hemoglobin A1c to screen for diabetes, which is normal.  Palma HolterKanishka G Shaylan Tutton, MD PGY 3 [facility]

## 2018-05-29 ENCOUNTER — Telehealth: Payer: Self-pay

## 2018-05-29 NOTE — Telephone Encounter (Signed)
-----   Message from Palma HolterKanishka G Gunadasa, MD sent at 05/29/2018  7:25 AM EDT ----- This patient has an appointment tomorrow for a pap per the chief complaint. She does not need a pap. She needs to be seen for a colposcopy because her last pap was abnormal. This referral was made months ago and GYN has been trying to get in touch with her. Please inform patient of this (trying to save her from a possible unnecessary visit).

## 2018-05-29 NOTE — Telephone Encounter (Signed)
I called patient- pt stated she was recently "cheated on" and would like STD testing, she thought that's what pap smears tested for. Will dicuss GYN referral tomm as patient was becoming agitated.

## 2018-05-30 ENCOUNTER — Other Ambulatory Visit (HOSPITAL_COMMUNITY)
Admission: RE | Admit: 2018-05-30 | Discharge: 2018-05-30 | Disposition: A | Discharge status: Home / Self Care | Payer: BLUE CROSS/BLUE SHIELD | Source: Ambulatory Visit | Attending: Family Medicine | Admitting: Family Medicine

## 2018-05-30 ENCOUNTER — Encounter: Payer: Self-pay | Admitting: Internal Medicine

## 2018-05-30 ENCOUNTER — Other Ambulatory Visit: Payer: Self-pay

## 2018-05-30 ENCOUNTER — Ambulatory Visit (INDEPENDENT_AMBULATORY_CARE_PROVIDER_SITE_OTHER): Payer: BLUE CROSS/BLUE SHIELD | Admitting: Internal Medicine

## 2018-05-30 VITALS — BP 130/80 | HR 82 | Temp 98.6°F | Ht 64.0 in | Wt 185.0 lb

## 2018-05-30 DIAGNOSIS — F419 Anxiety disorder, unspecified: Secondary | ICD-10-CM | POA: Diagnosis not present

## 2018-05-30 DIAGNOSIS — R350 Frequency of micturition: Secondary | ICD-10-CM | POA: Insufficient documentation

## 2018-05-30 DIAGNOSIS — E282 Polycystic ovarian syndrome: Secondary | ICD-10-CM | POA: Insufficient documentation

## 2018-05-30 DIAGNOSIS — F329 Major depressive disorder, single episode, unspecified: Secondary | ICD-10-CM | POA: Diagnosis not present

## 2018-05-30 DIAGNOSIS — N898 Other specified noninflammatory disorders of vagina: Secondary | ICD-10-CM | POA: Insufficient documentation

## 2018-05-30 DIAGNOSIS — R7309 Other abnormal glucose: Secondary | ICD-10-CM | POA: Diagnosis not present

## 2018-05-30 LAB — POCT URINALYSIS DIP (MANUAL ENTRY)
BILIRUBIN UA: NEGATIVE
Glucose, UA: NEGATIVE mg/dL
Ketones, POC UA: NEGATIVE mg/dL
NITRITE UA: NEGATIVE
PH UA: 6 (ref 5.0–8.0)
Protein Ur, POC: NEGATIVE mg/dL
Spec Grav, UA: 1.01 (ref 1.010–1.025)
UROBILINOGEN UA: 0.2 U/dL

## 2018-05-30 LAB — POCT WET PREP (WET MOUNT)
Clue Cells Wet Prep Whiff POC: NEGATIVE
TRICHOMONAS WET PREP HPF POC: ABSENT

## 2018-05-30 LAB — POCT UA - MICROSCOPIC ONLY

## 2018-05-30 LAB — GLUCOSE, POCT (MANUAL RESULT ENTRY): POC GLUCOSE: 127 mg/dL — AB (ref 70–99)

## 2018-05-30 LAB — POCT GLYCOSYLATED HEMOGLOBIN (HGB A1C): HEMOGLOBIN A1C: 5.2 % (ref 4.0–5.6)

## 2018-05-30 NOTE — Patient Instructions (Signed)
1) I will call you about the rest of the testing

## 2018-06-01 ENCOUNTER — Telehealth: Payer: Self-pay | Admitting: Internal Medicine

## 2018-06-01 LAB — URINE CULTURE

## 2018-06-01 LAB — CERVICOVAGINAL ANCILLARY ONLY
CHLAMYDIA, DNA PROBE: NEGATIVE
NEISSERIA GONORRHEA: NEGATIVE

## 2018-06-01 NOTE — Telephone Encounter (Signed)
Attempted to call patient to report that her urine culture did grow a significant amount of bacteria. Due to this and since she is having urinary frequency, I think she would benefit from antibiotic treatment (Keflex). Left message to call back. Will not order prescription until we can get in touch with her.

## 2018-06-04 ENCOUNTER — Telehealth: Payer: Self-pay | Admitting: Internal Medicine

## 2018-06-04 MED ORDER — CEPHALEXIN 500 MG PO CAPS: 500.0000 mg | ORAL_CAPSULE | Freq: Four times a day (QID) | ORAL | 0 refills | Status: AC

## 2018-06-04 NOTE — Telephone Encounter (Signed)
Spoke with patient about urine culture. Will treat as acute cystitis with Keflex.

## 2018-06-29 ENCOUNTER — Telehealth: Payer: Self-pay

## 2018-06-29 NOTE — Telephone Encounter (Signed)
If she is asymptomatic and the urine data is from almost 1 month ago I would not treat this. If patient is having urinary symptoms or fevers please have her come in to be seen. If she is would like her urine retested please have her come in to be seen. Thanks.

## 2018-06-29 NOTE — Telephone Encounter (Signed)
Pt called nurse line stating her medication called in by gunadasa was not at her pharmacy this morning. Per chart review, G called in Keflex on 6/17 for UTI, but pt never picked up. I asked pt if she is still having sxs since it has been a month. Pt stated she was never having sxs and was shocked to hear back in June she had an infection. Pt would like to start this medication today. I can not send in under G name anymore. Can you send in a new rx for this?

## 2018-07-04 NOTE — Telephone Encounter (Signed)
Spoke with patient and informed her an antibiotic is not needed at this time. Pt voiced understanding and stated she is not having any sxs and will call if she begins to have some.

## 2019-01-03 ENCOUNTER — Ambulatory Visit (INDEPENDENT_AMBULATORY_CARE_PROVIDER_SITE_OTHER): Payer: BLUE CROSS/BLUE SHIELD | Admitting: Family Medicine

## 2019-01-03 ENCOUNTER — Encounter: Payer: Self-pay | Admitting: Family Medicine

## 2019-01-03 ENCOUNTER — Other Ambulatory Visit: Payer: Self-pay

## 2019-01-03 ENCOUNTER — Other Ambulatory Visit (HOSPITAL_COMMUNITY)
Admission: RE | Admit: 2019-01-03 | Discharge: 2019-01-03 | Disposition: A | Discharge status: Home / Self Care | Payer: BLUE CROSS/BLUE SHIELD | Source: Ambulatory Visit | Attending: Family Medicine | Admitting: Family Medicine

## 2019-01-03 VITALS — Wt 202.0 lb

## 2019-01-03 DIAGNOSIS — Z7251 High risk heterosexual behavior: Secondary | ICD-10-CM | POA: Diagnosis not present

## 2019-01-03 DIAGNOSIS — R110 Nausea: Secondary | ICD-10-CM

## 2019-01-03 DIAGNOSIS — A64 Unspecified sexually transmitted disease: Secondary | ICD-10-CM

## 2019-01-03 DIAGNOSIS — R87613 High grade squamous intraepithelial lesion on cytologic smear of cervix (HGSIL): Secondary | ICD-10-CM

## 2019-01-03 LAB — POCT WET PREP (WET MOUNT)
CLUE CELLS WET PREP WHIFF POC: POSITIVE
TRICHOMONAS WET PREP HPF POC: ABSENT

## 2019-01-03 LAB — POCT URINE PREGNANCY: Preg Test, Ur: NEGATIVE

## 2019-01-03 MED ORDER — OMEPRAZOLE 20 MG PO CPDR: 20.0000 mg | DELAYED_RELEASE_CAPSULE | Freq: Every day | ORAL | 3 refills | Status: DC

## 2019-01-03 MED ORDER — ONDANSETRON HCL 4 MG PO TABS: 4.0000 mg | ORAL_TABLET | Freq: Three times a day (TID) | ORAL | 0 refills | Status: DC | PRN

## 2019-01-03 NOTE — Patient Instructions (Addendum)
I believe your nausea is from a persistent inflammation of your stomach after your recent food poisoning.   It should improve over next two weeks.  In the meantime, I recommend using the stomach acid blocker, omeprazole for next 4 weeks to give your stomach protection against stomach acids while it heals.    If you have nausea that is bothering you a lot, take a ondansetron tablet.  It helps decrease the feeling of nausea.    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  This Pap smear result places you at increase risk of developing cerivcal cancer.  The progression to cancer can be prevented if it is attended to by health care providers.    We look forward to seeing you in our Colposcopy clinic 01/22/19 where we can start the process of addressing this abnormality of the cervix.    We are testing you for infections with gonorrhea, chlamydia, HIV and Syphilis.  We will let you know the results of the testing when it becomes available.         Gastritis, Adult Gastritis is inflammation of the stomach. There are two kinds of gastritis:  Acute gastritis. This kind develops suddenly.  Chronic gastritis. This kind is much more common and lasts for a long time. Gastritis happens when the lining of the stomach becomes weak or gets damaged. Without treatment, gastritis can lead to stomach bleeding and ulcers. What are the causes? This condition may be caused by:  An infection.  Drinking too much alcohol.  Certain medicines. These include steroids, antibiotics, and some over-the-counter medicines, such as aspirin or ibuprofen.  Having too much acid in the stomach.  A disease of the intestines or stomach.  Stress.  An allergic reaction.  Crohn's disease.  Some cancer treatments (radiation). Sometimes the cause of this condition is not known. What are the signs or symptoms? Symptoms of this condition  include:  Pain or a burning sensation in the upper abdomen.  Nausea.  Vomiting.  An uncomfortable feeling of fullness after eating.  Weight loss.  Bad breath.  Blood in your vomit or stools. In some cases, there are no symptoms. How is this diagnosed? This condition may be diagnosed with:  Your medical history and a description of your symptoms.  A physical exam.  Tests. These can include: ? Blood tests. ? Stool tests. ? A test in which a thin, flexible instrument with a light and a camera is passed down the esophagus and into the stomach (upper endoscopy). ? A test in which a sample of tissue is taken for testing (biopsy). How is this treated? This condition may be treated with medicines. The medicines that are used vary depending on the cause of the gastritis:  If the condition is caused by a bacterial infection, you may be given antibiotic medicines.  If the condition is caused by too much acid in the stomach, you may be given medicines called H2 blockers, proton pump inhibitors, or antacids. Treatment may also involve stopping the use of certain medicines, such as aspirin, ibuprofen, or other NSAIDs. Follow these instructions at home: Medicines  Take over-the-counter and prescription medicines only as told by your health care provider.  If you were prescribed an antibiotic medicine, take it as told by your health care provider. Do not stop taking the antibiotic even if you start to feel better. Eating and drinking   Eat small, frequent meals instead of large  meals.  Avoid foods and drinks that make your symptoms worse.  Drink enough fluid to keep your urine pale yellow. Alcohol use  Do not drink alcohol if: ? Your health care provider tells you not to drink. ? You are pregnant, may be pregnant, or are planning to become pregnant.  If you drink alcohol: ? Limit your use to:  0-1 drink a day for women.  0-2 drinks a day for men. ? Be aware of how much  alcohol is in your drink. In the [place] one drink equals one 12 oz bottle of beer (355 mL), one 5 oz glass of wine (148 mL), or one 1 oz glass of hard liquor (44 mL). General instructions  Talk with your health care provider about ways to manage stress, such as getting regular exercise or practicing deep breathing, meditation, or yoga.  Do not use any products that contain nicotine or tobacco, such as cigarettes and e-cigarettes. If you need help quitting, ask your health care provider.  Keep all follow-up visits as told by your health care provider. This is important. Contact a health care provider if:  Your symptoms get worse.  Your symptoms return after treatment. Get help right away if:  You vomit blood or material that looks like coffee grounds.  You have black or dark red stools.  You are unable to keep fluids down.  Your abdominal pain gets worse.  You have a fever.  You do not feel better after one week. Summary  Gastritis is inflammation of the lining of the stomach that can occur suddenly (acute) or develop slowly over time (chronic).  This condition is diagnosed with a medical history, a physical exam, or tests.  This condition may be treated with medicines to treat infection or medicines to reduce the amount of acid in your stomach.  Follow your health care provider's instructions about taking medicines, making changes to your diet, and knowing when to call for help.

## 2019-01-03 NOTE — Progress Notes (Signed)
History of Present Illness   Patient Identification Kaylee Spencer is a 38 y.o. female. Records Reviewed: Old medical records. Patient information was obtained from patient and past medical records. History/Exam limitations: none. Patient presented by private vehicle  Chief Complaint  std testing and Nausea   Nausea  Onset: two weeks ago SEEN PREVIOUSLY FOR THIS?: none EVALUATION TO DATE: none TX TO DATE: none DESCRIPTION OF SX: mild abdominal pain DENIES: alcohol overuse, fever, hematemesis and melena        Course: stable Pattern: intermittent increase Eating/Drinking: sometimes makes worse Associated: Onset two hours after eating at restaurant two weeks ago.  Nausea accompanied by diarrhea  Symptoms Vomiting: no  Pelvic Pain: no    Abdominal/Pelvic Pain precede vomiting:  no  Vomiting of food eaten several hours earlier: no  Chest Pain:  no  Abdominal Swelling:  no  Headache: no  Vertigo: YES/NO/WILD [id number]}   Diarrhea: yes  Constipation: no  Melena/BRBPR: no  Hematemesis: no  Anorexia: no  Fever/Chills: no  Dysuria: no  Rash: no  Wt loss: no  EtOH use: no  NSAIDs/ASA: no Birth Control: no   LMP: 12/09/18, "never normal" bc of PCOS Vaginal bleeding: yes, brown DC initially then blood spotting last week  STD risk/hx: yes, concern that her female partner has not not been monogamous. Unprotected sex practiced. Negative GC/Chlamydia 2008/2013. 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  Informed by Dr [facility] Crosby who scheduled ptient with GYN.  Pt reports not attending this GYN appointment.  DIARRHEA  Onset: two weeks ago, same time as nausea  Time course: slowly improving consistency.  Still several times a day.  Previous treatment: no  Vomiting: no  Abdominal Pain: yes, epigastic, mild  Recent travel history: no    Sick contacts: no    Suspicious food or water: yes, onset soon after eating at  [employer] Fever: no  Bloody stools: no  Recent antibiotics: no  Immuncompromised: no   Past Medical History:  Diagnosis Date  . Anxiety   . Headache(784.0)   . Hypertension   . Migraine headache with aura    Family History  Problem Relation Age of Onset  . Hypertension Mother   . Glaucoma Father   . Stroke Father   . Anxiety disorder Sister   . Anxiety disorder Brother   . Anesthesia problems Neg Hx    Scheduled Meds: Continuous Infusions: PRN Meds:  No Known Allergies Social History   Socioeconomic History  . Marital status: Single    Spouse name: Not on file  . Number of children: Not on file  . Years of education: Not on file  . Highest education level: Not on file  Occupational History  . Occupation: Teacher, English as a foreign language  Social Needs  . Financial resource strain: Not on file  . Food insecurity:    Worry: Not on file    Inability: Not on file  . Transportation needs:    Medical: Not on file    Non-medical: Not on file  Tobacco Use  . Smoking status: Former Smoker    Last attempt to quit: 04/22/1998    Years since quitting: 20.7  . Smokeless tobacco: Never Used  Substance and Sexual Activity  . Alcohol use: Yes    Comment: occassional  . Drug use: No    Comment: as teenager  . Sexual activity: Yes    Birth control/protection: None  Lifestyle  .  Physical activity:    Days per week: Not on file    Minutes per session: Not on file  . Stress: Not on file  Relationships  . Social connections:    Talks on phone: Not on file    Gets together: Not on file    Attends religious service: Not on file    Active member of club or organization: Not on file    Attends meetings of clubs or organizations: Not on file    Relationship status: Not on file  . Intimate partner violence:    Fear of current or ex partner: Not on file    Emotionally abused: Not on file    Physically abused: Not on file    Forced sexual activity: Not on file  Other  Topics Concern  . Not on file  Social History Narrative  . Not on file   Review of Systems See HPI   Physical Exam   There were no vitals taken for this visit. General:   alert and cooperative  Heart: regular rate and rhythm, S1, S2 normal, no murmur, click, rub or gallop  Lungs: clear to auscultation bilaterally  Abdomen: soft, non-tender, without masses or organomegaly  Pelvic:    Vulva: normal   Vagina:  normal mucosa, scant pale thin discharge   Cervix: no bleeding following Pap and no cervical motion tenderness   Uterus: palpable uterine fundus.   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

## 2019-01-04 ENCOUNTER — Encounter: Payer: Self-pay | Admitting: Family Medicine

## 2019-01-04 DIAGNOSIS — Z7251 High risk heterosexual behavior: Secondary | ICD-10-CM | POA: Insufficient documentation

## 2019-01-04 LAB — RPR: RPR: NONREACTIVE

## 2019-01-04 LAB — CERVICOVAGINAL ANCILLARY ONLY
Chlamydia: NEGATIVE
Neisseria Gonorrhea: NEGATIVE

## 2019-01-04 LAB — HIV ANTIBODY (ROUTINE TESTING W REFLEX): HIV SCREEN 4TH GENERATION: NONREACTIVE

## 2019-01-04 NOTE — Assessment & Plan Note (Signed)
Patient failed to attend referral consultation with GYN in 2018. I discussed with patient the potential seriousness of the HGSIL on 2018 Pap with emphasis on need following up with [facility] 01/22/19.  She said she would.  She declined a note for work for this upcoming appointment.

## 2019-01-04 NOTE — Assessment & Plan Note (Signed)
New problem with no further workup Working Diagnosis is VGE with postinfectious intestinal diarrhea +/- gastritis Recommend Omeprazole for 30 days to help with possible gastritis healing Zofran tab prn nausea Immodium prn  RTC if fails to resolve over next couple weeks.

## 2019-01-04 NOTE — Assessment & Plan Note (Addendum)
New problem Wet prep pending GC/Chlamydia DNA probe pending HIV/RPR pending Safe sex practices discussed.

## 2019-01-07 ENCOUNTER — Encounter: Payer: Self-pay | Admitting: Family Medicine

## 2019-01-24 ENCOUNTER — Other Ambulatory Visit: Payer: Self-pay

## 2019-01-24 ENCOUNTER — Ambulatory Visit (INDEPENDENT_AMBULATORY_CARE_PROVIDER_SITE_OTHER): Payer: BLUE CROSS/BLUE SHIELD | Admitting: Family Medicine

## 2019-01-24 ENCOUNTER — Other Ambulatory Visit (HOSPITAL_COMMUNITY)
Admission: RE | Admit: 2019-01-24 | Discharge: 2019-01-24 | Disposition: A | Discharge status: Home / Self Care | Payer: BLUE CROSS/BLUE SHIELD | Source: Ambulatory Visit | Attending: Family Medicine | Admitting: Family Medicine

## 2019-01-24 VITALS — BP 135/82 | HR 82 | Temp 98.8°F | Wt 200.0 lb

## 2019-01-24 DIAGNOSIS — R87613 High grade squamous intraepithelial lesion on cytologic smear of cervix (HGSIL): Secondary | ICD-10-CM

## 2019-01-24 DIAGNOSIS — B977 Papillomavirus as the cause of diseases classified elsewhere: Secondary | ICD-10-CM | POA: Diagnosis not present

## 2019-01-24 LAB — POCT URINE PREGNANCY: PREG TEST UR: NEGATIVE

## 2019-01-24 NOTE — Patient Instructions (Signed)
We did a colposcopy today to follow-up her abnormal Pap.  He will have some spotting and bleeding for the next day or so.  You should not have any pelvic or abdominal pain of any significance, you should not have fever and should not have heavy bleeding.  If any of these issues occur, please call the office.  If the office is not open, please be seen at the emergency department.  I will have her test results early next week and I will give you a call.

## 2019-01-28 NOTE — Progress Notes (Signed)
Neg HPV 2016  Pap in 2018 HGSIL with + Hi risk HPV Was referred to gynecology for further evaluation and management of HGSIL.  Her chart review she was called several times by the gynecologist and failed to set up an appointment.  She was seen by a provider here in January for nausea and vomiting, they reminded her that she needed follow-up and she elected to have colposcopy here.  She denies any unusual vaginal discharge, no pelvic pain.  No unusual vaginal bleeding.  Patient given informed consent, signed copy in the chart.  Placed in lithotomy position. Cervix viewed with speculum and colposcope after application of acetic acid.   Colposcopy adequate (entire squamocolumnar junctions seen  in entirety) ?  Yes Acetowhite lesions?  No, but the area from 12:00 down to 6:00 on the cervix looked abnormal with some hyperpigmented dark areas that did not turn acetowhite, did not have any abnormal vasculature and no punctation was noted Punctation? no Mosaicism?  No Abnormal vasculature?  None Biopsies?  Yes.  Due to the abnormal nature of the 12:00 6:00 portion of the cervix, biopsy was obtained at approximately 12:00.  Cytobrush was also used endocervical sampling. ECC?  Yes with Cytobrush Complications?  Minimal bleeding.  Hemostasis was obtained with Monsel's and pressure using a Fox  COMMENTS:  Patient was given post procedure instructions.  I will notify her of pathology results.

## 2019-01-29 LAB — CYTOLOGY - PAP: DIAGNOSIS: NEGATIVE

## 2019-02-19 ENCOUNTER — Encounter: Payer: Self-pay | Admitting: Family Medicine

## 2019-11-05 ENCOUNTER — Telehealth: Payer: Self-pay | Admitting: *Deleted

## 2019-11-05 NOTE — Telephone Encounter (Signed)
Pt calls to schedule a physical but states that she has been having chest pain so was transferred to triage.  Pt has a sharp pain in chest under her breast occasionally with certain movements.  She denies Arm pain, jaw pain, SOB or nausea.   Advised of red flags to go to ED.  Appt made for physical. Christen Bame, CMA

## 2019-11-20 ENCOUNTER — Encounter: Payer: BLUE CROSS/BLUE SHIELD | Admitting: Family Medicine

## 2019-11-27 ENCOUNTER — Encounter: Payer: Self-pay | Admitting: Family Medicine

## 2019-11-27 ENCOUNTER — Other Ambulatory Visit (HOSPITAL_COMMUNITY)
Admission: RE | Admit: 2019-11-27 | Discharge: 2019-11-27 | Disposition: A | Discharge status: Home / Self Care | Payer: Self-pay | Source: Ambulatory Visit | Attending: Family Medicine | Admitting: Family Medicine

## 2019-11-27 ENCOUNTER — Other Ambulatory Visit: Payer: Self-pay

## 2019-11-27 ENCOUNTER — Ambulatory Visit (INDEPENDENT_AMBULATORY_CARE_PROVIDER_SITE_OTHER): Payer: Self-pay | Admitting: Family Medicine

## 2019-11-27 VITALS — BP 130/72 | HR 84 | Ht 64.0 in | Wt 187.4 lb

## 2019-11-27 DIAGNOSIS — Z202 Contact with and (suspected) exposure to infections with a predominantly sexual mode of transmission: Secondary | ICD-10-CM

## 2019-11-27 LAB — POCT WET PREP (WET MOUNT)
Clue Cells Wet Prep Whiff POC: NEGATIVE
Trichomonas Wet Prep HPF POC: ABSENT

## 2019-11-27 NOTE — Patient Instructions (Signed)
It was a pleasure to see you today! Thank you for choosing [facility] for your primary care. Kaylee Spencer was seen for general checkup and wet prep. Come back to the clinic if there is anything we can do for you.  Today we took wet prep at your request.  Want to remind you that we are able to do pregnancy test or other STD checking at any point that you are willing to consent to that.  We will glad that you are describing no significant health problems at this time.  We will communicate via MyChart with you if there is anything that we find that requires treatment.   Please bring all your medications to every doctors visit   Sign up for My Chart to have easy access to your labs results, and communication with your Primary care physician.     Please check-out at the front desk before leaving the clinic.     Best,  Dr. Sherene Sires FAMILY MEDICINE RESIDENT - PGY3 11/27/2019 10:00 AM

## 2019-11-27 NOTE — Progress Notes (Signed)
    Subjective:  Kaylee Spencer is a 38 y.o. female who presents to the [facility] today with a chief complaint of requesting a wet prep.   HPI: Possible exposure to STD Patient came in requesting wet prep, says she had a "slip up "and did have sex without a condom.  She said that she is safe at home and no one is making her do anything she does not want to do.  She is refusing pregnancy test or blood test for STI but does request a wet prep.  Says she has some minor discharge but no lesions or rash.  No other sick symptoms reported.  We did discuss pregnancy risk and she wishes to use condoms for birth control  At time of note wet prep was negative for trichomonas and BV although gonorrhea chlamydia are still pending  Objective:  Physical Exam: BP 130/72   Pulse 84   Ht 5\' 4"  (1.626 m)   Wt 187 lb 6 oz (85 kg)   LMP 11/17/2019   SpO2 99%   BMI 32.16 kg/m   Gen: NAD, pleasant and conversing comfortably CV: RRR with no murmurs appreciated Pulm: NWOB, CTAB with no crackles, wheezes, or rhonchi GI: Soft, Nontender, Nondistended. MSK: no edema, cyanosis, or clubbing noted *GU: Sensitive exam performed entirely with CMA in the room.*No pathologic lesions visualized, slight discharge, no blood Skin: warm, dry Neuro: grossly normal, moves all extremities Psych: Normal affect and thought content  Results for orders placed or performed in visit on 11/27/19 (from the past 72 hour(s))  POCT Wet Prep [facility] [place])     Status: Abnormal   Collection Time: 11/27/19 10:20 AM  Result Value Ref Range   Source Wet Prep POC VAG    WBC, Wet Prep HPF POC NONE    Bacteria Wet Prep HPF POC Moderate (A) Few   Clue Cells Wet Prep HPF POC None None   Clue Cells Wet Prep Whiff POC Negative Whiff    Yeast Wet Prep HPF POC None None   KOH Wet Prep POC None None   Trichomonas Wet Prep HPF POC Absent Absent     Assessment/Plan:  Possible exposure to STD Patient came in requesting wet prep, says she had a  "slip up "and did have sex without a condom.  She said that she is safe at home and no one is making her do anything she does not want to do.  She is refusing pregnancy test or blood test for STI but does request a wet prep.  Says she has some minor discharge but no lesions or rash.  No other sick symptoms reported.  We did discuss pregnancy risk and she wishes to use condoms for birth control  At time of note wet prep was negative for trichomonas and BV although gonorrhea chlamydia are still pending   Sherene Sires, [place] - PGY3 11/28/2019 3:05 PM

## 2019-11-28 DIAGNOSIS — Z202 Contact with and (suspected) exposure to infections with a predominantly sexual mode of transmission: Secondary | ICD-10-CM | POA: Insufficient documentation

## 2019-11-28 NOTE — Assessment & Plan Note (Signed)
Patient came in requesting wet prep, says she had a "slip up "and did have sex without a condom.  She said that she is safe at home and no one is making her do anything she does not want to do.  She is refusing pregnancy test or blood test for STI but does request a wet prep.  Says she has some minor discharge but no lesions or rash.  No other sick symptoms reported.  We did discuss pregnancy risk and she wishes to use condoms for birth control  At time of note wet prep was negative for trichomonas and BV although gonorrhea chlamydia are still pending

## 2019-11-29 LAB — CERVICOVAGINAL ANCILLARY ONLY
Bacterial Vaginitis (gardnerella): POSITIVE — AB
Candida Glabrata: NEGATIVE
Candida Vaginitis: POSITIVE — AB
Chlamydia: NEGATIVE
Comment: NEGATIVE
Comment: NEGATIVE
Comment: NEGATIVE
Comment: NEGATIVE
Comment: NEGATIVE
Comment: NORMAL
Neisseria Gonorrhea: NEGATIVE
Trichomonas: NEGATIVE

## 2019-11-30 ENCOUNTER — Encounter: Payer: Self-pay | Admitting: Family Medicine

## 2019-11-30 DIAGNOSIS — N760 Acute vaginitis: Secondary | ICD-10-CM

## 2019-11-30 DIAGNOSIS — B373 Candidiasis of vulva and vagina: Secondary | ICD-10-CM

## 2019-11-30 DIAGNOSIS — B9689 Other specified bacterial agents as the cause of diseases classified elsewhere: Secondary | ICD-10-CM

## 2019-11-30 DIAGNOSIS — B3731 Acute candidiasis of vulva and vagina: Secondary | ICD-10-CM

## 2019-11-30 MED ORDER — METRONIDAZOLE 500 MG PO TABS: 500.0000 mg | ORAL_TABLET | Freq: Two times a day (BID) | ORAL | 0 refills | Status: DC

## 2019-11-30 MED ORDER — FLUCONAZOLE 150 MG PO TABS: 150.0000 mg | ORAL_TABLET | Freq: Once | ORAL | 0 refills | Status: AC

## 2020-01-15 ENCOUNTER — Encounter: Payer: Self-pay | Admitting: Family Medicine

## 2020-01-15 ENCOUNTER — Ambulatory Visit (INDEPENDENT_AMBULATORY_CARE_PROVIDER_SITE_OTHER): Payer: Self-pay | Admitting: Family Medicine

## 2020-01-15 ENCOUNTER — Other Ambulatory Visit: Payer: Self-pay

## 2020-01-15 VITALS — BP 132/88 | HR 90 | Wt 191.6 lb

## 2020-01-15 DIAGNOSIS — L600 Ingrowing nail: Secondary | ICD-10-CM

## 2020-01-17 DIAGNOSIS — L600 Ingrowing nail: Secondary | ICD-10-CM | POA: Insufficient documentation

## 2020-01-17 NOTE — Assessment & Plan Note (Signed)
Gust options.  She does not want [place] to trim it if she can do it at home.  Discussed long-term that she may need partial or total nail removal and she is aware of that.  Gave her some topical antibiotic to use after she does any trimming at home instructed her to wash it well and use some alcohol and then dry it before putting the antibiotic on.  She will return as needed

## 2020-01-17 NOTE — Progress Notes (Signed)
    CHIEF COMPLAINT / HPI: 3 to 4 days of right great toe pain.  Think she has an ingrown nail.  Has had these before but usually can trim it out herself.  No sign of infection.   PERTINENT  PMH / PSH: I have reviewed the patient's medications, allergies, past medical and surgical history, smoking status and updated in the EMR as appropriate.   OBJECTIVE:  BP 132/88   Pulse 90   Wt 191 lb 9.6 oz (86.9 kg)   LMP 12/13/2019 (Exact Date)   SpO2 100%   BMI 32.89 kg/m   GENERAL: Well-developed no acute distress RIGHT great toe: Curved nails ingrowing on medial and lateral borders.  No sign of infection.  No erythema, no fluctuance. ASSESSMENT / PLAN:   Ingrown toenail of right foot Gust options.  She does not want [place] to trim it if she can do it at home.  Discussed long-term that she may need partial or total nail removal and she is aware of that.  Gave her some topical antibiotic to use after she does any trimming at home instructed her to wash it well and use some alcohol and then dry it before putting the antibiotic on.  She will return as needed   Denny Levy MD

## 2020-04-15 ENCOUNTER — Encounter: Payer: Self-pay | Admitting: Family Medicine

## 2020-04-15 ENCOUNTER — Other Ambulatory Visit: Payer: Self-pay

## 2020-04-15 ENCOUNTER — Ambulatory Visit (INDEPENDENT_AMBULATORY_CARE_PROVIDER_SITE_OTHER): Payer: Self-pay | Admitting: Family Medicine

## 2020-04-15 DIAGNOSIS — L732 Hidradenitis suppurativa: Secondary | ICD-10-CM

## 2020-04-15 MED ORDER — CHLORHEXIDINE GLUCONATE 4 % EX LIQD: Freq: Every day | CUTANEOUS | 0 refills | Status: DC | PRN

## 2020-04-15 MED ORDER — DOXYCYCLINE HYCLATE 100 MG PO TABS: 100.0000 mg | ORAL_TABLET | Freq: Two times a day (BID) | ORAL | 0 refills | Status: DC

## 2020-04-15 NOTE — Patient Instructions (Addendum)
Great meeting you today!  I am sorry about your skin boils.  I think your history and the recurrent nature of them is consistent more with something called hidradenitis suppurativa.  This is a chronic inflammatory skin condition.  This does not respond well to drainage.  We will use antibiotics and a special cleansing agent called Hibiclens for now.  I will give you a 10-day supply of the antibiotics.  Please come back and see [place] if it does not improve.  Hidradenitis Suppurativa Hidradenitis suppurativa is a long-term (chronic) skin disease. It is similar to a severe form of acne, but it affects areas of the body where acne would be unusual, especially areas of the body where skin rubs against skin and becomes moist. These include:  Underarms.  Groin.  Genital area.  Buttocks.  Upper thighs.  Breasts. Hidradenitis suppurativa may start out as small lumps or pimples caused by blocked sweat glands or hair follicles. Pimples may develop into deep sores that break open (rupture) and drain pus. Over time, affected areas of skin may thicken and become scarred. This condition is rare and does not spread from person to person (non-contagious). What are the causes? The exact cause of this condition is not known. It may be related to:  Female and female hormones.  An overactive disease-fighting system (immune system). The immune system may over-react to blocked hair follicles or sweat glands and cause swelling and pus-filled sores. What increases the risk? You are more likely to develop this condition if you:  Are female.  Are 49-64 years old.  Have a family history of hidradenitis suppurativa.  Have a personal history of acne.  Are overweight.  Smoke.  Take the medicine lithium. What are the signs or symptoms? The first symptoms are usually painful bumps in the skin, similar to pimples. The condition may get worse over time (progress), or it may only cause mild symptoms. If the disease  progresses, symptoms may include:  Skin bumps getting bigger and growing deeper into the skin.  Bumps rupturing and draining pus.  Itchy, infected skin.  Skin getting thicker and scarred.  Tunnels under the skin (fistulas) where pus drains from a bump.  Pain during daily activities, such as pain during walking if your groin area is affected.  Emotional problems, such as stress or depression. This condition may affect your appearance and your ability or willingness to wear certain clothes or do certain activities. How is this diagnosed? This condition is diagnosed by a health care provider who specializes in skin diseases (dermatologist). You may be diagnosed based on:  Your symptoms and medical history.  A physical exam.  Testing a pus sample for infection.  Blood tests. How is this treated? Your treatment will depend on how severe your symptoms are. The same treatment will not work for everybody with this condition. You may need to try several treatments to find what works best for you. Treatment may include:  Cleaning and bandaging (dressing) your wounds as needed.  Lifestyle changes, such as new skin care routines.  Taking medicines, such as: ? Antibiotics. ? Acne medicines. ? Medicines to reduce the activity of the immune system. ? A diabetes medicine (metformin). ? Birth control pills, for women. ? Steroids to reduce swelling and pain.  Working with a mental health care provider, if you experience emotional distress due to this condition. If you have severe symptoms that do not get better with medicine, you may need surgery. Surgery may involve:  Using  a laser to clear the skin and remove hair follicles.  Opening and draining deep sores.  Removing the areas of skin that are diseased and scarred. Follow these instructions at home: Medicines   Take over-the-counter and prescription medicines only as told by your health care provider.  If you were prescribed an  antibiotic medicine, take it as told by your health care provider. Do not stop taking the antibiotic even if your condition improves. Skin care  If you have open wounds, cover them with a clean dressing as told by your health care provider. Keep wounds clean by washing them gently with soap and water when you bathe.  Do not shave the areas where you get hidradenitis suppurativa.  Do not wear deodorant.  Wear loose-fitting clothes.  Try to avoid getting overheated or sweaty. If you get sweaty or wet, change into clean, dry clothes as soon as you can.  To help relieve pain and itchiness, cover sore areas with a warm, clean washcloth (warm compress) for 5-10 minutes as often as needed.  If told by your health care provider, take a bleach bath twice a week: ? Fill your bathtub halfway with water. ? Pour in  cup of unscented household bleach. ? Soak in the tub for 5-10 minutes. ? Only soak from the neck down. Avoid water on your face and hair. ? Shower to rinse off the bleach from your skin. General instructions  Learn as much as you can about your disease so that you have an active role in your treatment. Work closely with your health care provider to find treatments that work for you.  If you are overweight, work with your health care provider to lose weight as recommended.  Do not use any products that contain nicotine or tobacco, such as cigarettes and e-cigarettes. If you need help quitting, ask your health care provider.  If you struggle with living with this condition, talk with your health care provider or work with a mental health care provider as recommended.  Keep all follow-up visits as told by your health care provider. This is important. Where to find more information  Hidradenitis Suppurativa Foundation, Inc.: https://www.hs-foundation.org/ Contact a health care provider if you have:  A flare-up of hidradenitis suppurativa.  A fever or chills.  Trouble controlling  your symptoms at home.  Trouble doing your daily activities because of your symptoms.  Trouble dealing with emotional problems related to your condition. Summary  Hidradenitis suppurativa is a long-term (chronic) skin disease. It is similar to a severe form of acne, but it affects areas of the body where acne would be unusual.  The first symptoms are usually painful bumps in the skin, similar to pimples. The condition may get worse over time (progress), or it may only cause mild symptoms.  If you have open wounds, cover them with a clean dressing as told by your health care provider. Keep wounds clean by washing them gently with soap and water when you bathe.  Besides skin care, treatment may include medicines, laser treatment, and surgery. This information is not intended to replace advice given to you by your health care provider. Make sure you discuss any questions you have with your health care provider. Document Revised: 12/13/2017 Document Reviewed: 12/13/2017 Elsevier Patient Education  2020 [employer].

## 2020-04-16 DIAGNOSIS — L732 Hidradenitis suppurativa: Secondary | ICD-10-CM | POA: Insufficient documentation

## 2020-04-16 NOTE — Assessment & Plan Note (Addendum)
Patient's history and exam consistent with possible hidradenitis suppurativa.  Given the recurrent bilateral nature of these boils.  Given this diagnosis I do not feel that drainage is a good idea.  We will treat with doxycycline 100 mg twice daily for 10 days.  Gave patient prescription for Hibiclens.  Patient to follow-up at the end of this course and see how things are going, gave return precautions.  Could consider dermatology referral if fails to improve with the current regimen, but patient opted to defer for now.

## 2020-04-16 NOTE — Progress Notes (Signed)
   CHIEF COMPLAINT / HPI: 39 year old female who presents bilateral arm "boils".  Patient states that she has painful boils under bilateral armpits, left greater than right.  States that she has had these before.  She has had multiple boils drained in the past, which did provide temporary relief.  She states several smaller boils pop up every time, some of which have resolved on their own and is requiring antibiotics.  No fevers, chills, other symptoms.  PERTINENT  PMH / PSH: Recurrent boils   OBJECTIVE: BP 125/75   Pulse 87   Ht 5\' 5"  (1.651 m)   Wt 188 lb 9.6 oz (85.5 kg)   LMP 04/13/2020 (Exact Date)   SpO2 100%   BMI 31.38 kg/m   Gen: 39 year old female, no acute distress, very pleasant Cardiac: Skin warm and dry Respiratory: No accessory muscle use, no respiratory distress Neuro: Alert and oriented, Speech clear, No gross deficits  Right axilla: Multiple very small, painful, fluid-filled structures with mild induration, mild hyperpigmentation overlying, no drainage Left axilla: Multiple small painful, fluid-filled structures.  There exists one larger fluid-filled nodule, with more induration, and more hyperpigmentation overlying, no draining purulent material  ASSESSMENT / PLAN:  Hydradenitis Patient's history and exam consistent with possible hidradenitis suppurativa.  Given the recurrent bilateral nature of these boils.  Given this diagnosis I do not feel that drainage is a good idea.  We will treat with doxycycline 100 mg twice daily for 10 days.  Gave patient prescription for Hibiclens.  Patient to follow-up at the end of this course and see how things are going, gave return precautions.  Could consider dermatology referral if fails to improve with the current regimen, but patient opted to defer for now.   24 MD PGY-3 Family Medicine Resident [facility]

## 2020-08-03 ENCOUNTER — Emergency Department (HOSPITAL_COMMUNITY): Payer: BC Managed Care – PPO

## 2020-08-03 ENCOUNTER — Emergency Department (HOSPITAL_BASED_OUTPATIENT_CLINIC_OR_DEPARTMENT_OTHER): Payer: BC Managed Care – PPO

## 2020-08-03 ENCOUNTER — Other Ambulatory Visit: Payer: Self-pay

## 2020-08-03 ENCOUNTER — Encounter (HOSPITAL_COMMUNITY)
Admission: EM | Disposition: A | Discharge status: Home / Self Care | Payer: Self-pay | Source: Home / Self Care | Attending: Emergency Medicine

## 2020-08-03 ENCOUNTER — Observation Stay (HOSPITAL_COMMUNITY)
Admission: EM | Admit: 2020-08-03 | Discharge: 2020-08-04 | Disposition: A | Discharge status: Home / Self Care | Payer: BC Managed Care – PPO | Attending: Cardiovascular Disease | Admitting: Cardiovascular Disease

## 2020-08-03 ENCOUNTER — Encounter (HOSPITAL_COMMUNITY): Payer: Self-pay

## 2020-08-03 DIAGNOSIS — E876 Hypokalemia: Secondary | ICD-10-CM

## 2020-08-03 DIAGNOSIS — R079 Chest pain, unspecified: Secondary | ICD-10-CM

## 2020-08-03 DIAGNOSIS — E669 Obesity, unspecified: Secondary | ICD-10-CM | POA: Insufficient documentation

## 2020-08-03 DIAGNOSIS — Z87891 Personal history of nicotine dependence: Secondary | ICD-10-CM | POA: Insufficient documentation

## 2020-08-03 DIAGNOSIS — Z79899 Other long term (current) drug therapy: Secondary | ICD-10-CM | POA: Insufficient documentation

## 2020-08-03 DIAGNOSIS — I2510 Atherosclerotic heart disease of native coronary artery without angina pectoris: Secondary | ICD-10-CM

## 2020-08-03 DIAGNOSIS — Z8249 Family history of ischemic heart disease and other diseases of the circulatory system: Secondary | ICD-10-CM | POA: Diagnosis not present

## 2020-08-03 DIAGNOSIS — I119 Hypertensive heart disease without heart failure: Secondary | ICD-10-CM | POA: Insufficient documentation

## 2020-08-03 DIAGNOSIS — I214 Non-ST elevation (NSTEMI) myocardial infarction: Secondary | ICD-10-CM | POA: Diagnosis not present

## 2020-08-03 DIAGNOSIS — E119 Type 2 diabetes mellitus without complications: Secondary | ICD-10-CM | POA: Insufficient documentation

## 2020-08-03 DIAGNOSIS — Z955 Presence of coronary angioplasty implant and graft: Secondary | ICD-10-CM

## 2020-08-03 DIAGNOSIS — Z6831 Body mass index (BMI) 31.0-31.9, adult: Secondary | ICD-10-CM | POA: Insufficient documentation

## 2020-08-03 DIAGNOSIS — Z20822 Contact with and (suspected) exposure to covid-19: Secondary | ICD-10-CM | POA: Diagnosis not present

## 2020-08-03 DIAGNOSIS — E785 Hyperlipidemia, unspecified: Secondary | ICD-10-CM | POA: Diagnosis not present

## 2020-08-03 DIAGNOSIS — I10 Essential (primary) hypertension: Secondary | ICD-10-CM

## 2020-08-03 DIAGNOSIS — I25110 Atherosclerotic heart disease of native coronary artery with unstable angina pectoris: Secondary | ICD-10-CM | POA: Insufficient documentation

## 2020-08-03 HISTORY — PX: CORONARY STENT INTERVENTION: CATH118234

## 2020-08-03 HISTORY — PX: LEFT HEART CATH AND CORONARY ANGIOGRAPHY: CATH118249

## 2020-08-03 LAB — CBC
HCT: 40.5 % (ref 36.0–46.0)
Hemoglobin: 13.4 g/dL (ref 12.0–15.0)
MCH: 29 pg (ref 26.0–34.0)
MCHC: 33.1 g/dL (ref 30.0–36.0)
MCV: 87.7 fL (ref 80.0–100.0)
Platelets: 284 10*3/uL (ref 150–400)
RBC: 4.62 MIL/uL (ref 3.87–5.11)
RDW: 13.3 % (ref 11.5–15.5)
WBC: 10 10*3/uL (ref 4.0–10.5)
nRBC: 0 % (ref 0.0–0.2)

## 2020-08-03 LAB — BASIC METABOLIC PANEL
Anion gap: 12 (ref 5–15)
BUN: 17 mg/dL (ref 6–20)
CO2: 24 mmol/L (ref 22–32)
Calcium: 9.8 mg/dL (ref 8.9–10.3)
Chloride: 103 mmol/L (ref 98–111)
Creatinine, Ser: 0.92 mg/dL (ref 0.44–1.00)
GFR calc Af Amer: >60 mL/min (ref >60)
GFR calc non Af Amer: >60 mL/min (ref >60)
Glucose, Bld: 131 mg/dL — ABNORMAL HIGH (ref 70–99)
Potassium: 4 mmol/L (ref 3.5–5.1)
Sodium: 139 mmol/L (ref 135–145)

## 2020-08-03 LAB — HEMOGLOBIN A1C
Hgb A1c MFr Bld: 5.8 % — ABNORMAL HIGH (ref 4.8–5.6)
Mean Plasma Glucose: 119.76 mg/dL

## 2020-08-03 LAB — TROPONIN I (HIGH SENSITIVITY)
Troponin I (High Sensitivity): 157 ng/L (ref <18)
Troponin I (High Sensitivity): 306 ng/L (ref <18)

## 2020-08-03 LAB — TSH: TSH: 0.762 u[IU]/mL (ref 0.350–4.500)

## 2020-08-03 LAB — SARS CORONAVIRUS 2 BY RT PCR (HOSPITAL ORDER, PERFORMED IN ~~LOC~~ HOSPITAL LAB): SARS Coronavirus 2: NEGATIVE

## 2020-08-03 LAB — ECHOCARDIOGRAM COMPLETE
Area-P 1/2: 3.85 cm2
Height: 65 in
S' Lateral: 2.9 cm
Weight: 2992 oz

## 2020-08-03 LAB — I-STAT BETA HCG BLOOD, ED (MC, WL, AP ONLY): I-stat hCG, quantitative: <5 m[IU]/mL (ref <5)

## 2020-08-03 LAB — POCT ACTIVATED CLOTTING TIME: Activated Clotting Time: 274 seconds

## 2020-08-03 LAB — HIV ANTIBODY (ROUTINE TESTING W REFLEX): HIV Screen 4th Generation wRfx: NONREACTIVE

## 2020-08-03 SURGERY — LEFT HEART CATH AND CORONARY ANGIOGRAPHY
Anesthesia: LOCAL

## 2020-08-03 MED ORDER — SODIUM CHLORIDE 0.9% FLUSH: 3.0000 mL | INTRAVENOUS | Status: DC | PRN

## 2020-08-03 MED ORDER — ASPIRIN 300 MG RE SUPP: 300.0000 mg | RECTAL | Status: AC

## 2020-08-03 MED ORDER — HEPARIN SODIUM (PORCINE) 1000 UNIT/ML IJ SOLN
INTRAMUSCULAR | Status: DC | PRN
  Administered 2020-08-03: 5000 [IU] via INTRAVENOUS
  Administered 2020-08-03: 4500 [IU] via INTRAVENOUS
  Administered 2020-08-03: 2000 [IU] via INTRAVENOUS

## 2020-08-03 MED ORDER — PERFLUTREN LIPID MICROSPHERE
1.0000 mL | INTRAVENOUS | Status: AC | PRN
  Administered 2020-08-03: 2 mL via INTRAVENOUS
  Filled 2020-08-03: qty 10

## 2020-08-03 MED ORDER — ACETAMINOPHEN 325 MG PO TABS: 650.0000 mg | ORAL_TABLET | ORAL | Status: DC | PRN

## 2020-08-03 MED ORDER — ASPIRIN EC 81 MG PO TBEC: 81.0000 mg | DELAYED_RELEASE_TABLET | Freq: Every day | ORAL | Status: DC

## 2020-08-03 MED ORDER — SODIUM CHLORIDE 0.9 % WEIGHT BASED INFUSION: 1.0000 mL/kg/h | INTRAVENOUS | Status: DC

## 2020-08-03 MED ORDER — SODIUM CHLORIDE 0.9 % IV SOLN: 250.0000 mL | INTRAVENOUS | Status: DC | PRN

## 2020-08-03 MED ORDER — VERAPAMIL HCL 2.5 MG/ML IV SOLN
INTRA_ARTERIAL | Status: DC | PRN
  Administered 2020-08-03: 8 mL via INTRA_ARTERIAL
  Administered 2020-08-03: 5 mL via INTRA_ARTERIAL
  Administered 2020-08-03: 7 mL via INTRA_ARTERIAL

## 2020-08-03 MED ORDER — ASPIRIN 81 MG PO CHEW: 81.0000 mg | CHEWABLE_TABLET | ORAL | Status: DC

## 2020-08-03 MED ORDER — SODIUM CHLORIDE 0.9% FLUSH
3.0000 mL | Freq: Two times a day (BID) | INTRAVENOUS | Status: DC
  Administered 2020-08-04: 3 mL via INTRAVENOUS

## 2020-08-03 MED ORDER — SODIUM CHLORIDE 0.9% FLUSH: 3.0000 mL | Freq: Two times a day (BID) | INTRAVENOUS | Status: DC

## 2020-08-03 MED ORDER — NITROGLYCERIN 0.4 MG SL SUBL: 0.4000 mg | SUBLINGUAL_TABLET | SUBLINGUAL | Status: DC | PRN

## 2020-08-03 MED ORDER — ATORVASTATIN CALCIUM 80 MG PO TABS
80.0000 mg | ORAL_TABLET | Freq: Every day | ORAL | Status: DC
  Administered 2020-08-03 – 2020-08-04 (×2): 80 mg via ORAL
  Filled 2020-08-03 (×2): qty 1

## 2020-08-03 MED ORDER — MIDAZOLAM HCL 2 MG/2ML IJ SOLN
INTRAMUSCULAR | Status: AC
  Filled 2020-08-03: qty 2

## 2020-08-03 MED ORDER — METOPROLOL TARTRATE 12.5 MG HALF TABLET
12.5000 mg | ORAL_TABLET | Freq: Two times a day (BID) | ORAL | Status: DC
  Administered 2020-08-03 – 2020-08-04 (×3): 12.5 mg via ORAL
  Filled 2020-08-03 (×3): qty 1

## 2020-08-03 MED ORDER — HYDRALAZINE HCL 20 MG/ML IJ SOLN
10.0000 mg | INTRAMUSCULAR | Status: AC | PRN
  Administered 2020-08-03: 10 mg via INTRAVENOUS

## 2020-08-03 MED ORDER — SODIUM CHLORIDE 0.9 % IV SOLN: INTRAVENOUS | Status: AC

## 2020-08-03 MED ORDER — FENTANYL CITRATE (PF) 100 MCG/2ML IJ SOLN
INTRAMUSCULAR | Status: DC | PRN
  Administered 2020-08-03: 25 ug via INTRAVENOUS

## 2020-08-03 MED ORDER — HEPARIN (PORCINE) IN NACL 1000-0.9 UT/500ML-% IV SOLN
INTRAVENOUS | Status: AC
  Filled 2020-08-03: qty 1000

## 2020-08-03 MED ORDER — HEPARIN (PORCINE) 25000 UT/250ML-% IV SOLN
900.0000 [IU]/h | INTRAVENOUS | Status: DC
  Filled 2020-08-03: qty 250

## 2020-08-03 MED ORDER — TICAGRELOR 90 MG PO TABS
ORAL_TABLET | ORAL | Status: DC | PRN
  Administered 2020-08-03: 180 mg via ORAL

## 2020-08-03 MED ORDER — ONDANSETRON HCL 4 MG/2ML IJ SOLN: 4.0000 mg | Freq: Four times a day (QID) | INTRAMUSCULAR | Status: DC | PRN

## 2020-08-03 MED ORDER — PANTOPRAZOLE SODIUM 40 MG PO TBEC
40.0000 mg | DELAYED_RELEASE_TABLET | Freq: Every day | ORAL | Status: DC
  Administered 2020-08-04: 40 mg via ORAL
  Filled 2020-08-03: qty 1

## 2020-08-03 MED ORDER — HYDRALAZINE HCL 20 MG/ML IJ SOLN
INTRAMUSCULAR | Status: AC
  Filled 2020-08-03: qty 1

## 2020-08-03 MED ORDER — HEPARIN (PORCINE) IN NACL 1000-0.9 UT/500ML-% IV SOLN
INTRAVENOUS | Status: DC | PRN
  Administered 2020-08-03 (×2): 500 mL

## 2020-08-03 MED ORDER — HEPARIN BOLUS VIA INFUSION
4000.0000 [IU] | Freq: Once | INTRAVENOUS | Status: DC
  Filled 2020-08-03: qty 4000

## 2020-08-03 MED ORDER — MORPHINE SULFATE (PF) 2 MG/ML IV SOLN: 2.0000 mg | INTRAVENOUS | Status: DC | PRN

## 2020-08-03 MED ORDER — SODIUM CHLORIDE 0.9 % WEIGHT BASED INFUSION
3.0000 mL/kg/h | INTRAVENOUS | Status: DC
  Administered 2020-08-03 (×2): 3 mL/kg/h via INTRAVENOUS

## 2020-08-03 MED ORDER — MIDAZOLAM HCL 2 MG/2ML IJ SOLN
INTRAMUSCULAR | Status: DC | PRN
  Administered 2020-08-03: 1 mg via INTRAVENOUS

## 2020-08-03 MED ORDER — ASPIRIN 81 MG PO CHEW
324.0000 mg | CHEWABLE_TABLET | ORAL | Status: AC
  Administered 2020-08-03: 324 mg via ORAL
  Filled 2020-08-03: qty 4

## 2020-08-03 MED ORDER — FENTANYL CITRATE (PF) 100 MCG/2ML IJ SOLN
INTRAMUSCULAR | Status: AC
  Filled 2020-08-03: qty 2

## 2020-08-03 MED ORDER — VERAPAMIL HCL 2.5 MG/ML IV SOLN
INTRAVENOUS | Status: AC
  Filled 2020-08-03: qty 2

## 2020-08-03 MED ORDER — TICAGRELOR 90 MG PO TABS
90.0000 mg | ORAL_TABLET | Freq: Two times a day (BID) | ORAL | Status: DC
  Administered 2020-08-04: 90 mg via ORAL
  Filled 2020-08-03: qty 1

## 2020-08-03 MED ORDER — NITROGLYCERIN 1 MG/10 ML FOR IR/CATH LAB
INTRA_ARTERIAL | Status: AC
  Filled 2020-08-03: qty 10

## 2020-08-03 MED ORDER — HEPARIN SODIUM (PORCINE) 1000 UNIT/ML IJ SOLN
INTRAMUSCULAR | Status: AC
  Filled 2020-08-03: qty 1

## 2020-08-03 MED ORDER — IOHEXOL 350 MG/ML SOLN
INTRAVENOUS | Status: DC | PRN
  Administered 2020-08-03: 150 mL via INTRA_ARTERIAL

## 2020-08-03 MED ORDER — ATORVASTATIN CALCIUM 40 MG PO TABS: 40.0000 mg | ORAL_TABLET | Freq: Every day | ORAL | Status: DC

## 2020-08-03 MED ORDER — LABETALOL HCL 5 MG/ML IV SOLN
10.0000 mg | INTRAVENOUS | Status: AC | PRN
  Administered 2020-08-03: 10 mg via INTRAVENOUS

## 2020-08-03 MED ORDER — LIDOCAINE HCL (PF) 1 % IJ SOLN
INTRAMUSCULAR | Status: AC
  Filled 2020-08-03: qty 30

## 2020-08-03 MED ORDER — ASPIRIN 81 MG PO CHEW
81.0000 mg | CHEWABLE_TABLET | Freq: Every day | ORAL | Status: DC
  Administered 2020-08-04: 81 mg via ORAL
  Filled 2020-08-03: qty 1

## 2020-08-03 MED ORDER — LABETALOL HCL 5 MG/ML IV SOLN
INTRAVENOUS | Status: AC
  Filled 2020-08-03: qty 4

## 2020-08-03 SURGICAL SUPPLY — 23 items
BALLN SAPPHIRE 2.0X12 (BALLOONS) ×2
BALLN SAPPHIRE ~~LOC~~ 2.5X12 (BALLOONS) ×1 IMPLANT
BALLN ~~LOC~~ EMERGE MR 2.75X20 (BALLOONS) ×2
BALLOON SAPPHIRE 2.0X12 (BALLOONS) IMPLANT
BALLOON ~~LOC~~ EMERGE MR 2.75X20 (BALLOONS) IMPLANT
CATH INFINITI 5 FR JL3.5 (CATHETERS) ×1 IMPLANT
CATH OPTITORQUE TIG 4.0 5F (CATHETERS) ×1 IMPLANT
CATH VISTA GUIDE 6FR JR4 (CATHETERS) ×1 IMPLANT
CATH VISTA GUIDE 6FR XB3 (CATHETERS) ×1 IMPLANT
DEVICE RAD COMP TR BAND LRG (VASCULAR PRODUCTS) ×1 IMPLANT
GLIDESHEATH SLEND A-KIT 6F 22G (SHEATH) ×1 IMPLANT
GUIDEWIRE INQWIRE 1.5J.035X260 (WIRE) IMPLANT
INQWIRE 1.5J .035X260CM (WIRE) ×2
KIT ENCORE 26 ADVANTAGE (KITS) ×1 IMPLANT
KIT HEART LEFT (KITS) ×2 IMPLANT
PACK CARDIAC CATHETERIZATION (CUSTOM PROCEDURE TRAY) ×2 IMPLANT
STENT RESOLUTE ONYX 2.5X26 (Permanent Stent) ×1 IMPLANT
STENT SYNERGY XD 2.25X16 (Permanent Stent) IMPLANT
SYNERGY XD 2.25X16 (Permanent Stent) ×2 IMPLANT
TRANSDUCER W/STOPCOCK (MISCELLANEOUS) ×2 IMPLANT
TUBING CIL FLEX 10 FLL-RA (TUBING) ×2 IMPLANT
WIRE ASAHI PROWATER 180CM (WIRE) ×1 IMPLANT
WIRE HI TORQ VERSACORE-J 145CM (WIRE) ×1 IMPLANT

## 2020-08-03 NOTE — ED Notes (Signed)
Heparin was given as well aas bolus when 2nd IV was started. Ella Jubilee RN verified both and signed off. Computer then shut down and dual sign off was not recorded in system. Pt then moved to cath lab. However bolus and drip started approximately 1540

## 2020-08-03 NOTE — ED Provider Notes (Signed)
[facility] EMERGENCY DEPARTMENT Provider Note   CSN: [id number] Arrival date & time: 08/03/20  0747     History Chief Complaint  Patient presents with  . Chest Pain    Jashanti Holzworth is a 39 y.o. female.  39 yo F with a chief complaint of chest pain.  She felt like this was a severe crushing pain across her chest that went into her back and she felt symptoms in her hands as well as her head.  She got sweaty had trouble breathing and had one episode of loose stools.  She denied any abdominal pain with this denies cough congestion or fever.  Patient denies history of MI.  Denies history of hypertension hyperlipidemia diabetes smoking or family history of MI.  She denies history of PE or DVT denies hemoptysis denies unilateral lower extremity edema denies recent surgery immobilization or hospitalization.  The history is provided by the patient.  Chest Pain Pain location:  Substernal area Pain quality: aching and pressure   Pain radiates to:  Does not radiate Pain severity:  Moderate Onset quality:  Gradual Duration:  4 hours Timing:  Constant Progression:  Partially resolved Chronicity:  New Relieved by:  Nothing Worsened by:  Nothing Ineffective treatments:  None tried Associated symptoms: diaphoresis and shortness of breath   Associated symptoms: no dizziness, no fever, no headache, no nausea, no palpitations and no vomiting     HPI: A 39 year old patient with a history of obesity presents for evaluation of chest pain. Initial onset of pain was approximately 3-6 hours ago. The patient's chest pain is described as heaviness/pressure/tightness and is not worse with exertion. The patient complains of nausea and reports some diaphoresis. The patient's chest pain is middle- or left-sided, is not well-localized, is not sharp and does radiate to the arms/jaw/neck. The patient has no history of stroke, has no history of peripheral artery disease, has not smoked in  the past 90 days, denies any history of treated diabetes, has no relevant family history of coronary artery disease (first degree relative at less than age 40), is not hypertensive and has no history of hypercholesterolemia.   Past Medical History:  Diagnosis Date  . Anxiety   . Headache(784.0)   . 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 Hypertension   . Migraine headache with aura     Patient Active Problem List   Diagnosis Date Noted  . NSTEMI (non-ST elevated myocardial infarction) (HCC) 08/03/2020  . Hydradenitis 04/16/2020  . Ingrown toenail of right foot 01/17/2020  . Possible exposure to STD 11/28/2019  . High risk sexual behavior 01/04/2019  . Nausea without vomiting 01/03/2019  . PCOS (polycystic ovarian syndrome) 11/30/2017  . 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 Depression 05/24/2012  . Anxiety 04/27/2012  . DUB (dysfunctional uterine bleeding) 02/15/2007    Past Surgical History:  Procedure Laterality Date  . boil removal    . DILATION AND CURETTAGE OF UTERUS    . tooth extraction Bilateral      OB History    Gravida  1   Para  0   Term  0   Preterm  0   AB  1   Living  0     SAB  1   TAB  0   Ectopic  0   Multiple  0   Live Births              Family History  Problem Relation Age  of Onset  . Hypertension Mother   . Glaucoma Father   . Stroke Father   . Anxiety disorder Sister   . Anxiety disorder Brother   . Anesthesia problems Neg Hx     Social History   Tobacco Use  . Smoking status: Former Smoker    Quit date: 04/22/1998    Years since quitting: 22.2  . Smokeless tobacco: Never Used  Vaping Use  . Vaping Use: Never used  Substance Use Topics  . Alcohol use: Yes    Comment: occassional  . Drug use: No    Comment: as teenager    Home Medications Prior to Admission medications   Medication Sig Start Date End Date Taking? Authorizing Provider  chlorhexidine  (HIBICLENS) 4 % external liquid Apply topically daily as needed. Patient not taking: Reported on 08/03/2020 04/15/20   Myrene Buddy, MD  omeprazole (PRILOSEC) 20 MG capsule Take 1 capsule (20 mg total) by mouth daily. Patient not taking: Reported on 08/03/2020 01/03/19   McDiarmid, Leighton Roach, MD  ondansetron (ZOFRAN) 4 MG tablet Take 1 tablet (4 mg total) by mouth every 8 (eight) hours as needed for nausea or vomiting. Patient not taking: Reported on 08/03/2020 01/03/19   McDiarmid, Leighton Roach, MD    Allergies    Patient has no known allergies.  Review of Systems   Review of Systems  Constitutional: Positive for diaphoresis. Negative for chills and fever.  HENT: Negative for congestion and rhinorrhea.   Eyes: Negative for redness and visual disturbance.  Respiratory: Positive for shortness of breath. Negative for wheezing.   Cardiovascular: Positive for chest pain. Negative for palpitations.  Gastrointestinal: Negative for nausea and vomiting.  Genitourinary: Negative for dysuria and urgency.  Musculoskeletal: Negative for arthralgias and myalgias.  Skin: Negative for pallor and wound.  Neurological: Negative for dizziness and headaches.    Physical Exam Updated Vital Signs BP (!) 141/90   Pulse 80   Temp 98.3 F (36.8 C) (Oral)   Resp 20   Ht 5\' 5"  (1.651 m)   Wt 84.8 kg   SpO2 100%   BMI 31.12 kg/m   Physical Exam Vitals and nursing note reviewed.  Constitutional:      General: She is not in acute distress.    Appearance: She is well-developed. She is not diaphoretic.  HENT:     Head: Normocephalic and atraumatic.  Eyes:     Pupils: Pupils are equal, round, and reactive to light.  Cardiovascular:     Rate and Rhythm: Normal rate and regular rhythm.     Heart sounds: No murmur heard.  No friction rub. No gallop.   Pulmonary:     Effort: Pulmonary effort is normal.     Breath sounds: No wheezing or rales.  Chest:     Chest wall: Tenderness present.  Abdominal:      General: There is no distension.     Palpations: Abdomen is soft.     Tenderness: There is abdominal tenderness.     Comments: Mild epigastric tenderness  Musculoskeletal:        General: No tenderness.     Cervical back: Normal range of motion and neck supple.  Skin:    General: Skin is warm and dry.  Neurological:     Mental Status: She is alert and oriented to person, place, and time.  Psychiatric:        Behavior: Behavior normal.     ED Results / Procedures / Treatments   Labs (  all labs ordered are listed, but only abnormal results are displayed) Labs Reviewed  BASIC METABOLIC PANEL - Abnormal; Notable for the following components:      Result Value   Glucose, Bld 131 (*)    All other components within normal limits  TROPONIN I (HIGH SENSITIVITY) - Abnormal; Notable for the following components:   Troponin I (High Sensitivity) 157 (*)    All other components within normal limits  TROPONIN I (HIGH SENSITIVITY) - Abnormal; Notable for the following components:   Troponin I (High Sensitivity) 306 (*)    All other components within normal limits  CBC  HIV ANTIBODY (ROUTINE TESTING W REFLEX)  TSH  HEMOGLOBIN A1C  I-STAT BETA HCG BLOOD, ED ([facility], [facility], [facility] ONLY)    EKG EKG Interpretation  Date/Time:  Monday August 03 2020 07:56:36 EDT Ventricular Rate:  78 PR Interval:  152 QRS Duration: 82 QT Interval:  388 QTC Calculation: 442 R Axis:   21 Text Interpretation: Normal sinus rhythm Normal ECG No significant change since last tracing Confirmed by Melene Plan [phone]) on 08/03/2020 11:01:48 AM   Radiology DG Chest 2 View  Result Date: 08/03/2020 CLINICAL DATA:  Chest pain and shortness of breath EXAM: CHEST - 2 VIEW COMPARISON:  March 02, 2004 FINDINGS: Lungs are clear. The heart size and pulmonary vascularity are normal. No adenopathy. No pneumothorax. No bone lesions. IMPRESSION: Lungs clear.  Cardiac silhouette within normal limits. Electronically Signed   By: Bretta Bang III M.D.   On: 08/03/2020 08:49    Procedures Procedures (including critical care time)  Medications Ordered in ED Medications  nitroGLYCERIN (NITROSTAT) SL tablet 0.4 mg (has no administration in time range)  aspirin chewable tablet 324 mg (has no administration in time range)    Or  aspirin suppository 300 mg (has no administration in time range)  aspirin EC tablet 81 mg (has no administration in time range)  nitroGLYCERIN (NITROSTAT) SL tablet 0.4 mg (has no administration in time range)  acetaminophen (TYLENOL) tablet 650 mg (has no administration in time range)  ondansetron (ZOFRAN) injection 4 mg (has no administration in time range)  sodium chloride flush (NS) 0.9 % injection 3 mL (has no administration in time range)  0.9% sodium chloride infusion (has no administration in time range)    Followed by  0.9% sodium chloride infusion (has no administration in time range)  metoprolol tartrate (LOPRESSOR) tablet 12.5 mg (has no administration in time range)  atorvastatin (LIPITOR) tablet 40 mg (has no administration in time range)    ED Course  I have reviewed the triage vital signs and the nursing notes.  Pertinent labs & imaging results that were available during my care of the patient were reviewed by me and considered in my medical decision making (see chart for details).    MDM Rules/Calculators/A&P HEAR Score: 72                        39 yo F with a chief complaints of sudden onset chest pain.  This occurred when she was taking shower earlier today.  Described it as a feeling like her chest was caving in and then she felt symptoms into both of her arms and to her back and her head.  This is pretty much resolved but she is still having some mild ongoing discomfort that she describes as a pinching.  Her initial troponin was elevated at 150.  Could be a case of spontaneous coronary  dissection.  No prior medical history non-smoker no family history.  She is PERC  negative.  Discussed with cardiology will come and evaluate at bedside.  CRITICAL CARE Performed by: Rae Roamaniel Patrick Carolanne Mercier   Total critical care time: 35 minutes  Critical care time was exclusive of separately billable procedures and treating other patients.  Critical care was necessary to treat or prevent imminent or life-threatening deterioration.  Critical care was time spent personally by me on the following activities: development of treatment plan with patient and/or surrogate as well as nursing, discussions with consultants, evaluation of patient's response to treatment, examination of patient, obtaining history from patient or surrogate, ordering and performing treatments and interventions, ordering and review of laboratory studies, ordering and review of radiographic studies, pulse oximetry and re-evaluation of patient's condition.  The patients results and plan were reviewed and discussed.   Any x-rays performed were independently reviewed by myself.   Differential diagnosis were considered with the presenting HPI.  Medications  nitroGLYCERIN (NITROSTAT) SL tablet 0.4 mg (has no administration in time range)  aspirin chewable tablet 324 mg (has no administration in time range)    Or  aspirin suppository 300 mg (has no administration in time range)  aspirin EC tablet 81 mg (has no administration in time range)  nitroGLYCERIN (NITROSTAT) SL tablet 0.4 mg (has no administration in time range)  acetaminophen (TYLENOL) tablet 650 mg (has no administration in time range)  ondansetron (ZOFRAN) injection 4 mg (has no administration in time range)  sodium chloride flush (NS) 0.9 % injection 3 mL (has no administration in time range)  0.9% sodium chloride infusion (has no administration in time range)    Followed by  0.9% sodium chloride infusion (has no administration in time range)  metoprolol tartrate (LOPRESSOR) tablet 12.5 mg (has no administration in time range)  atorvastatin  (LIPITOR) tablet 40 mg (has no administration in time range)    Vitals:   08/03/20 0754 08/03/20 0934 08/03/20 1016 08/03/20 1249  BP: 126/84 136/89 (!) 138/94 (!) 141/90  Pulse: 75 72 73 80  Resp: 14 16 20    Temp: 98.3 F (36.8 C)     TempSrc: Oral     SpO2: 100% 100% 99% 100%  Weight:      Height:        Final diagnoses:  NSTEMI (non-ST elevated myocardial infarction) [facility](HCC)    Admission/ observation were discussed with the admitting physician, patient and/or family and they are comfortable with the plan.    Final Clinical Impression(s) / ED Diagnoses Final diagnoses:  NSTEMI (non-ST elevated myocardial infarction) [facility](HCC)    Rx / DC Orders ED Discharge Orders    None       Melene PlanFloyd, Adonte Vanriper, DO 08/03/20 1325

## 2020-08-03 NOTE — Progress Notes (Signed)
Patient states she feels short of breath. Respirations regular and nonlabored. O2 sat 100% room air. Patient received loading dose Brilinta in lab. Warm and dry, pink. Vital signs stable. Denies pain. Reassured patient that SOB is a side effect of the Brilinta. Will monitor.

## 2020-08-03 NOTE — Progress Notes (Signed)
ANTICOAGULATION CONSULT NOTE  Pharmacy Consult for heparin Indication: chest pain/ACS  Heparin Dosing Weight: 75.3 kg  Labs: Recent Labs    08/03/20 0810  HGB 13.4  HCT 40.5  PLT 284  CREATININE 0.92    Assessment: 39 yof presenting with CP, elevated troponin. Pharmacy consulted to dose heparin for NSTEMI. Patient is not on anticoagulation PTA. CBC wnl. No active bleed issues reported.  Goal of Therapy:  Heparin level 0.3-0.7 units/ml Monitor platelets by anticoagulation protocol: Yes   Plan:  Heparin 4000 unit bolus Start heparin at 900 units/h 6h heparin level Monitor daily heparin level and CBC, s/sx bleeding Planning cath either today or tomorrow  Babs Bertin, PharmD, BCPS Clinical Pharmacist 08/03/2020 2:26 PM

## 2020-08-03 NOTE — H&P (Signed)
Cardiology Admission History and Physical:   Patient ID: Kaylee Spencer MRN: [id number]; DOB: Nov 25, 1981   Admission date: 08/03/2020  Primary Care Provider: Derrel Nip, MD [facility] Cardiologist: No primary care provider on file.  [facility] Electrophysiologist:  None   Chief Complaint:  Chest pain  Patient Profile:   Kaylee Spencer is a 39 y.o. female with no prior cardiac history who is being evaluted for chest pain.   History of Present Illness:   Kaylee Spencer has not been seen by cardiology in the past. No significant cardiac history. Patient reports she has high blood pressure but does not take anything for it. Also has occasional GERD. No history of stroke, cancer, diabetes, hyperlipidemia. Denies family history of heart attack, stents, or CABG. She is relatively healthy and does yoga during the week. She works full-time Medical sales representative job). Denies tobacco and drug use. She drinks 2-3 times weekly.   The patient presented to the ED 08/03/20 with chest pain. This morning while taking a shower the patient had sudden onset centralized chest pain. It was a pressure 10/10 and radiated into her back. She felt numbness and tingling in her arms. She had associated sob, diaphoresis, and nausea. She also had bowel incontinence. EMS was called who administered Aspirin which minimally relived the pain. Pain last about 30 minutes. She has never felt this pain before. She was brought to the ED  In the ED BP 138/94, pulse 73, afebrile, 99% O2, RR 20. Labs show potassium 4.0, glucose 131, creatinine 0.92, BUN 17, WBC 10, Hgb 13.4. HS troponin 157. EKG with no ischemic changes. HCG negative. CXR unremarkable. Cardiology was consulted for admission.   On my interview the patient is chest pain free. States the pain has been coming and going, lasting about 5 minutes. Denies recent fever, chills, LLE, orthopnea, abd pain.    Past Medical History:  Diagnosis Date  . Anxiety   .  Headache(784.0)   . 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 Hypertension   . Migraine headache with aura     Past Surgical History:  Procedure Laterality Date  . boil removal    . DILATION AND CURETTAGE OF UTERUS    . tooth extraction Bilateral      Medications Prior to Admission: Prior to Admission medications   Medication Sig Start Date End Date Taking? Authorizing Provider  chlorhexidine (HIBICLENS) 4 % external liquid Apply topically daily as needed. 04/15/20   Myrene Buddy, MD  doxycycline (VIBRA-TABS) 100 MG tablet Take 1 tablet (100 mg total) by mouth 2 (two) times daily. 04/15/20   Myrene Buddy, MD  metroNIDAZOLE (FLAGYL) 500 MG tablet Take 1 tablet (500 mg total) by mouth 2 (two) times daily. 11/30/19   Marthenia Rolling, DO  omeprazole (PRILOSEC) 20 MG capsule Take 1 capsule (20 mg total) by mouth daily. 01/03/19   McDiarmid, Leighton Roach, MD  ondansetron (ZOFRAN) 4 MG tablet Take 1 tablet (4 mg total) by mouth every 8 (eight) hours as needed for nausea or vomiting. 01/03/19   McDiarmid, Leighton Roach, MD     Allergies:   No Known Allergies  Social History:   Social History   Socioeconomic History  . Marital status: Single    Spouse name: Not on file  . Number of children: Not on file  . Years of education: Not on file  . Highest education level: Not on file  Occupational History  . Occupation: Teacher, English as a foreign language  Tobacco Use  . Smoking status: Former  Smoker    Quit date: 04/22/1998    Years since quitting: 22.2  . Smokeless tobacco: Never Used  Vaping Use  . Vaping Use: Never used  Substance and Sexual Activity  . Alcohol use: Yes    Comment: occassional  . Drug use: No    Comment: as teenager  . Sexual activity: Yes    Birth control/protection: None  Other Topics Concern  . Not on file  Social History Narrative  . Not on file   Social Determinants of Health   Financial Resource Strain:   . Difficulty of Paying Living Expenses:     Food Insecurity:   . Worried About Programme researcher, broadcasting/film/video in the Last Year:   . Barista in the Last Year:   Transportation Needs:   . Freight forwarder (Medical):   Marland Kitchen Lack of Transportation (Non-Medical):   Physical Activity:   . Days of Exercise per Week:   . Minutes of Exercise per Session:   Stress:   . Feeling of Stress :   Social Connections:   . Frequency of Communication with Friends and Family:   . Frequency of Social Gatherings with Friends and Family:   . Attends Religious Services:   . Active Member of Clubs or Organizations:   . Attends Banker Meetings:   Marland Kitchen Marital Status:   Intimate Partner Violence:   . Fear of Current or Ex-Partner:   . Emotionally Abused:   Marland Kitchen Physically Abused:   . Sexually Abused:     Family History:   The patient's family history includes Anxiety disorder in her brother and sister; Glaucoma in her father; Hypertension in her mother; Stroke in her father. There is no history of Anesthesia problems.    ROS:  Please see the history of present illness.  All other ROS reviewed and negative.     Physical Exam/Data:   Vitals:   08/03/20 0753 08/03/20 0754 08/03/20 0934 08/03/20 1016  BP:  126/84 136/89 (!) 138/94  Pulse:  75 72 73  Resp:  14 16 20   Temp:  98.3 F (36.8 C)    TempSrc:  Oral    SpO2:  100% 100% 99%  Weight: 84.8 kg     Height: 5\' 5"  (1.651 m)      No intake or output data in the 24 hours ending 08/03/20 1151 Last 3 Weights 08/03/2020 04/15/2020 01/15/2020  Weight (lbs) 187 lb 188 lb 9.6 oz 191 lb 9.6 oz  Weight (kg) 84.823 kg 85.548 kg 86.909 kg     Body mass index is 31.12 kg/m.  General:  Well nourished, well developed, in no acute distress HEENT: normal Lymph: no adenopathy Neck: no JVD Endocrine:  No thryomegaly Vascular: No carotid bruits; FA pulses 2+ bilaterally without bruits  Cardiac:  normal S1, S2; RRR; no murmur  Lungs:  clear to auscultation bilaterally, no wheezing, rhonchi or  rales  Abd: soft, nontender, no hepatomegaly  Ext: no edema Musculoskeletal:  No deformities, BUE and BLE strength normal and equal Skin: warm and dry  Neuro:  CNs 2-12 intact, no focal abnormalities noted Psych:  Normal affect    EKG:  The ECG that was done 08/03/20 was personally reviewed and demonstrates NSR, 78 bpm, nonspecific ST/TW changes  Relevant CV Studies:    Laboratory Data:  High Sensitivity Troponin:   Recent Labs  Lab 08/03/20 0810  TROPONINIHS 157*      Chemistry Recent Labs  Lab 08/03/20 0810  NA 139  K 4.0  CL 103  CO2 24  GLUCOSE 131*  BUN 17  CREATININE 0.92  CALCIUM 9.8  GFRNONAA >60  GFRAA >60  ANIONGAP 12    No results for input(s): PROT, ALBUMIN, AST, ALT, ALKPHOS, BILITOT in the last 168 hours. Hematology Recent Labs  Lab 08/03/20 0810  WBC 10.0  RBC 4.62  HGB 13.4  HCT 40.5  MCV 87.7  MCH 29.0  MCHC 33.1  RDW 13.3  PLT 284   BNPNo results for input(s): BNP, PROBNP in the last 168 hours.  DDimer No results for input(s): DDIMER in the last 168 hours.   Radiology/Studies:  DG Chest 2 View  Result Date: 08/03/2020 CLINICAL DATA:  Chest pain and shortness of breath EXAM: CHEST - 2 VIEW COMPARISON:  March 02, 2004 FINDINGS: Lungs are clear. The heart size and pulmonary vascularity are normal. No adenopathy. No pneumothorax. No bone lesions. IMPRESSION: Lungs clear.  Cardiac silhouette within normal limits. Electronically Signed   By: Bretta Bang III M.D.   On: 08/03/2020 08:49    TIMI Risk Score for Unstable Angina or Non-ST Elevation MI:   The patient's TIMI risk score is  , which indicates a  % risk of all cause mortality, new or recurrent myocardial infarction or need for urgent revascularization in the next 14 days.    Assessment and Plan:   NSTEMI - presents with centralized chest pressure with associated sob, diaphoresis and nausea. Given Aspirin 324mg  by EMS - HS troponin 105>306. Continue to trend - EKG  unremarkable - RF for CAD include obesity, ?HTN - risk stratify with lipid profile and A1C - start IV heparin - check echo - plan for admission and cath Risks and benefits of cardiac catheterization have been discussed with the patient.  These include bleeding, infection, kidney damage, stroke, heart attack, death. The patient understands these risks and is willing to proceed.  Severity of Illness: The appropriate patient status for this patient is OBSERVATION. Observation status is judged to be reasonable and necessary in order to provide the required intensity of service to ensure the patient's safety. The patient's presenting symptoms, physical exam findings, and initial radiographic and laboratory data in the context of their medical condition is felt to place them at decreased risk for further clinical deterioration. Furthermore, it is anticipated that the patient will be medically stable for discharge from the hospital within 2 midnights of admission. The following factors support the patient status of observation.   " The patient's presenting symptoms include  Chest pain. " The physical exam findings include chest pain. " The initial radiographic and laboratory data are elevated troponin.     For questions or updates, please contact [facility] Please consult [email or web address] for contact info under     Signed, Josten Warmuth , PA-C  08/03/2020 11:51 AM

## 2020-08-03 NOTE — Interval H&P Note (Signed)
Cath Lab Visit (complete for each Cath Lab visit)  Clinical Evaluation Leading to the Procedure:   ACS: Yes.    Non-ACS:    Anginal Classification: CCS III  Anti-ischemic medical therapy: No Therapy  Non-Invasive Test Results: No non-invasive testing performed  Prior CABG: No previous CABG      History and Physical Interval Note:  08/03/2020 5:45 PM  Kaylee Spencer  has presented today for surgery, with the diagnosis of chest pain.  The various methods of treatment have been discussed with the patient and family. After consideration of risks, benefits and other options for treatment, the patient has consented to  Procedure(s): LEFT HEART CATH AND CORONARY ANGIOGRAPHY (N/A) as a surgical intervention.  The patient's history has been reviewed, patient examined, no change in status, stable for surgery.  I have reviewed the patient's chart and labs.  Questions were answered to the patient's satisfaction.     [name]

## 2020-08-03 NOTE — ED Triage Notes (Signed)
Pt from home with ems for chest pain while taking a shower, no other symptoms. No hx of cardiac problems. Hypertensive with ems. Pt a.o, given 324 ASA en route. Pt a.o

## 2020-08-04 ENCOUNTER — Encounter (HOSPITAL_COMMUNITY): Payer: Self-pay | Admitting: Cardiovascular Disease

## 2020-08-04 DIAGNOSIS — I2510 Atherosclerotic heart disease of native coronary artery without angina pectoris: Secondary | ICD-10-CM | POA: Diagnosis not present

## 2020-08-04 DIAGNOSIS — E876 Hypokalemia: Secondary | ICD-10-CM

## 2020-08-04 DIAGNOSIS — E785 Hyperlipidemia, unspecified: Secondary | ICD-10-CM

## 2020-08-04 DIAGNOSIS — I214 Non-ST elevation (NSTEMI) myocardial infarction: Secondary | ICD-10-CM | POA: Diagnosis not present

## 2020-08-04 DIAGNOSIS — Z20822 Contact with and (suspected) exposure to covid-19: Secondary | ICD-10-CM | POA: Diagnosis not present

## 2020-08-04 DIAGNOSIS — I10 Essential (primary) hypertension: Secondary | ICD-10-CM

## 2020-08-04 LAB — CBC
HCT: 35.7 % — ABNORMAL LOW (ref 36.0–46.0)
Hemoglobin: 12.4 g/dL (ref 12.0–15.0)
MCH: 29.7 pg (ref 26.0–34.0)
MCHC: 34.7 g/dL (ref 30.0–36.0)
MCV: 85.4 fL (ref 80.0–100.0)
Platelets: 283 10*3/uL (ref 150–400)
RBC: 4.18 MIL/uL (ref 3.87–5.11)
RDW: 13.2 % (ref 11.5–15.5)
WBC: 9.5 10*3/uL (ref 4.0–10.5)
nRBC: 0 % (ref 0.0–0.2)

## 2020-08-04 LAB — BASIC METABOLIC PANEL
Anion gap: 9 (ref 5–15)
BUN: 8 mg/dL (ref 6–20)
CO2: 22 mmol/L (ref 22–32)
Calcium: 9.1 mg/dL (ref 8.9–10.3)
Chloride: 106 mmol/L (ref 98–111)
Creatinine, Ser: 0.75 mg/dL (ref 0.44–1.00)
GFR calc Af Amer: >60 mL/min (ref >60)
GFR calc non Af Amer: >60 mL/min (ref >60)
Glucose, Bld: 114 mg/dL — ABNORMAL HIGH (ref 70–99)
Potassium: 3.4 mmol/L — ABNORMAL LOW (ref 3.5–5.1)
Sodium: 137 mmol/L (ref 135–145)

## 2020-08-04 LAB — LIPID PANEL
Cholesterol: 196 mg/dL (ref 0–200)
HDL: 38 mg/dL — ABNORMAL LOW (ref >40)
LDL Cholesterol: 128 mg/dL — ABNORMAL HIGH (ref 0–99)
Total CHOL/HDL Ratio: 5.2 RATIO
Triglycerides: 149 mg/dL (ref <150)
VLDL: 30 mg/dL (ref 0–40)

## 2020-08-04 LAB — POCT ACTIVATED CLOTTING TIME: Activated Clotting Time: 312 s

## 2020-08-04 MED ORDER — ASPIRIN 81 MG PO CHEW: 81.0000 mg | CHEWABLE_TABLET | Freq: Every day | ORAL | Status: DC

## 2020-08-04 MED ORDER — LOSARTAN POTASSIUM 25 MG PO TABS: 25.0000 mg | ORAL_TABLET | Freq: Every day | ORAL | 3 refills | Status: DC

## 2020-08-04 MED ORDER — ATORVASTATIN CALCIUM 80 MG PO TABS: 80.0000 mg | ORAL_TABLET | Freq: Every day | ORAL | 3 refills | Status: DC

## 2020-08-04 MED ORDER — NITROGLYCERIN 0.4 MG SL SUBL: 0.4000 mg | SUBLINGUAL_TABLET | SUBLINGUAL | 0 refills | Status: DC | PRN

## 2020-08-04 MED ORDER — POTASSIUM CHLORIDE CRYS ER 20 MEQ PO TBCR
40.0000 meq | EXTENDED_RELEASE_TABLET | Freq: Once | ORAL | Status: AC
  Administered 2020-08-04: 40 meq via ORAL
  Filled 2020-08-04: qty 2

## 2020-08-04 MED ORDER — LOSARTAN POTASSIUM 25 MG PO TABS
25.0000 mg | ORAL_TABLET | Freq: Every day | ORAL | Status: DC
  Administered 2020-08-04: 25 mg via ORAL
  Filled 2020-08-04: qty 1

## 2020-08-04 MED ORDER — TICAGRELOR 90 MG PO TABS: 90.0000 mg | ORAL_TABLET | Freq: Two times a day (BID) | ORAL | 3 refills | Status: DC

## 2020-08-04 MED ORDER — METOPROLOL TARTRATE 25 MG PO TABS: 12.5000 mg | ORAL_TABLET | Freq: Two times a day (BID) | ORAL | 3 refills | Status: DC

## 2020-08-04 MED FILL — METOPROLOL TARTRATE 25 MG T: 25 | 30 days supply | Qty: 30 | Fill #0

## 2020-08-04 MED FILL — BRILINTA 90 MG TABLET: 90 | 30 days supply | Qty: 60 | Fill #0

## 2020-08-04 MED FILL — ATORVASTATIN CALCIUM 80 MG: 80 | 30 days supply | Qty: 30 | Fill #0

## 2020-08-04 MED FILL — NITROGLYCERIN 0.4 MG TAB SL: 0.4 | 7 days supply | Qty: 25 | Fill #0

## 2020-08-04 MED FILL — LOSARTAN POTASSIUM 25 MG TA: 25 | 30 days supply | Qty: 30 | Fill #0

## 2020-08-04 NOTE — Progress Notes (Signed)
CARDIAC REHAB PHASE I   PRE:  Rate/Rhythm: 85 SR    BP: sitting 142/96    SaO2: 100 RA  MODE:  Ambulation: 430 ft   POST:  Rate/Rhythm: 83 SR    BP: sitting 152/92     SaO2: 100 RA  Tolerated well, does endorse slight dizziness. No CP. BP still elevated. Discussed MI, stents, restrictions, Brilinta, diet, exercise, NTG, and CRPII. Pt voiced understanding, very receptive. Understands the importance of Brilinta and other meds  (prefers more natural therapy PTA). Will refer to [place] CRPII. [id number]   Harriet Masson CES, ACSM 08/04/2020 9:59 AM

## 2020-08-04 NOTE — Discharge Summary (Signed)
Discharge Summary    Patient ID: Kaylee [facility] MRN: [id number]; DOB: [birth year]  Admit date: 08/03/2020 Discharge date: 08/04/2020  Primary Care Provider: Gifford Shave, [facility]  Primary Cardiologist: Dr. Rogers Seeds, [facility]   Discharge Diagnoses    Principal Problem:   NSTEMI (non-ST elevated myocardial infarction) [facility]) Active Problems:   HLD (hyperlipidemia)  Diagnostic Studies/Procedures    LHC with coronary stent intervention 08/03/2020:  IMPRESSION: Successful PCI drug-eluting stenting of a high-grade proximal ramus branch stenosis and a mid codominant RCA stenosis using drug-eluting stents.  Patient will need at least 12 months of uninterrupted DAPT as well as guideline directed optimal medical therapy including beta-blocker, high-dose statin therapy plus or minus an ACE inhibitor.  Diagnostic Dominance: Co-dominant  Intervention  ___________  Echocardiogram 08/03/2020:  1. Left ventricular ejection fraction, by estimation, is 60 to 65%. The  left ventricle has normal function. The left ventricle has no regional  wall motion abnormalities. Left ventricular diastolic parameters were  normal.  2. Right ventricular systolic function is normal. The right ventricular  size is normal.  3. The mitral valve is normal in structure. No evidence of mitral valve  regurgitation. No evidence of mitral stenosis.  4. The aortic valve is normal in structure. Aortic valve regurgitation is  not visualized. No aortic stenosis is present.  5. The inferior vena cava is normal in size with greater than 50%  respiratory variability, suggesting right atrial pressure of 3 mmHg.    History of Present Illness     Kaylee Spencer is a 39 y.o. female with no prior cardiac history who presented to the ED on 08/03/2020 for the evaluation of chest pain.  Kaylee Spencer has not been seen by cardiology in the past.  She has no significant cardiac history.  On presentation, she reported BPs have  been elevated however was not on medication.  She was in her usual state of health prior to admission however on the morning of presentation while taking a shower she had sudden onset of centralized chest pain measuring a 10 out of 10 with radiation to her back with associated numbness and tingling in bilateral arms.  Also with associated shortness of breath, diaphoresis and nausea with bowel incontinence.  Given her symptoms, she called for EMS for transport to the ED for further evaluation.  On their assessment, she was given ASA with minimal relief.  At bedtime troponin found to be elevated at 157 with stable EKG with no ischemic changes.  Plan was for [facility] for further coronary evaluation.  Hospital Course     LHC performed 08/03/2020 proximal to mid RCA lesion at 80% stenosis, ramus-1 lesion at 70% stenosis, ramus-2 lesion at 95% stenosis, ramus-3 lesion at 70% stenosis.  Successful PCI/DES placed in the high-grade proximal ramus branch and at the RCA stenosis sites.  Plan is for uninterrupted DAPT for at least 1 year with ASA and Brilinta.  Also will need to optimize medical therapy to include beta-blocker, high-dose statin and ACEI/ARB  CAD s/p non-STEMI: -Patient presented with acute onset chest pain with elevated troponin with a peak at 306 and new EKG changes. -LHC as above with PCI placement and plans for DAPT with ASA and Brilinta for minimum of 1 year -Cath site stable -No recurrent chest pain  HLD: -LDL elevated at 128 this admission -We will place on high intensity statin therapy -Recheck lipid panel with LFTs in 8 weeks  Hypokalemia: -Replaced with 40 mEq K. Dur today -Recheck labs follow-up -Starting on  losartan   Hypertension: -Elevated, is over 75, 166/81, 162/74, 193/79 -Add losartan 25 mg p.o. daily -Check labs at follow-up  General: Well developed, well nourished, NAD Lungs:Clear to ausculation bilaterally. Breathing is unlabored. Cardiovascular: RRR with S1 S2. No  murmurs, rubs, gallops, or LV heave appreciated. Extremities: No edema. Radial pulses 2+ bilaterally Neuro: Alert and oriented. No focal deficits. No facial asymmetry. MAE spontaneously. Psych: Responds to questions appropriately with normal affect.    Consultants: None  The patient was seen and examined by Dr. Audie Box who feels that she is stable and ready for discharge today, 08/04/2020.  Cath site stable without signs of hematoma or acute bleeding.  She has had no further chest pain.  She has ambulated with cardiac rehabilitation without complication.  Did the patient have an acute coronary syndrome (MI, NSTEMI, STEMI, etc) this admission?:  Yes                               AHA/ACC Clinical Performance & Quality Measures: 1. Aspirin prescribed? - Yes 2. ADP Receptor Inhibitor (Plavix/Clopidogrel, Brilinta/Ticagrelor or Effient/Prasugrel) prescribed (includes medically managed patients)? - Yes 3. Beta Blocker prescribed? - Yes 4. High Intensity Statin (Lipitor 40-42m or Crestor 20-469m prescribed? - Yes 5. EF assessed during THIS hospitalization? - Yes 6. For EF <40%, was ACEI/ARB prescribed? - Not Applicable (EF >/= 4025%7. For EF <40%, Aldosterone Antagonist (Spironolactone or Eplerenone) prescribed? - Not Applicable (EF >/= 4085%8. Cardiac Rehab Phase II ordered (including medically managed patients)? - Yes  _____________  Discharge Vitals Blood pressure 136/75, pulse 78, temperature 99 F (37.2 C), temperature source Oral, resp. rate 19, height 5' 5"  (1.651 m), weight 84.3 kg, SpO2 100 %.  Filed Weights   08/03/20 0753 08/04/20 0358  Weight: 84.8 kg 84.3 kg   Labs & Radiologic Studies    CBC Recent Labs    08/03/20 0810 08/04/20 0344  WBC 10.0 9.5  HGB 13.4 12.4  HCT 40.5 35.7*  MCV 87.7 85.4  PLT 284 28277 Basic Metabolic Panel Recent Labs    08/03/20 0810 08/04/20 0344  NA 139 137  K 4.0 3.4*  CL 103 106  CO2 24 22  GLUCOSE 131* 114*  BUN 17 8  CREATININE  0.92 0.75  CALCIUM 9.8 9.1   Liver Function Tests No results for input(s): AST, ALT, ALKPHOS, BILITOT, PROT, ALBUMIN in the last 72 hours. No results for input(s): LIPASE, AMYLASE in the last 72 hours. High Sensitivity Troponin:   Recent Labs  Lab 08/03/20 0810 08/03/20 1122  TROPONINIHS 157* 306*    BNP Invalid input(s): POCBNP D-Dimer No results for input(s): DDIMER in the last 72 hours. Hemoglobin A1C Recent Labs    08/03/20 1642  HGBA1C 5.8*   Fasting Lipid Panel Recent Labs    08/04/20 0344  CHOL 196  HDL 38*  LDLCALC 128*  TRIG 149  CHOLHDL 5.2   Thyroid Function Tests Recent Labs    08/03/20 1641  TSH 0.762   _____________  DG Chest 2 View  Result Date: 08/03/2020 CLINICAL DATA:  Chest pain and shortness of breath EXAM: CHEST - 2 VIEW COMPARISON:  March 02, 2004 FINDINGS: Lungs are clear. The heart size and pulmonary vascularity are normal. No adenopathy. No pneumothorax. No bone lesions. IMPRESSION: Lungs clear.  Cardiac silhouette within normal limits. Electronically Signed   By: WiLowella GripII M.D.   On: 08/03/2020 08:49  CARDIAC CATHETERIZATION  Result Date: 08/03/2020  Prox RCA to Mid RCA lesion is 80% stenosed.  Ramus-1 lesion is 70% stenosed.  Ramus-2 lesion is 95% stenosed.  Ramus-3 lesion is 70% stenosed.  A drug-eluting stent was successfully placed using a STENT RESOLUTE ONYX 2.5X26.  Post intervention, there is a 0% residual stenosis.  A drug-eluting stent was successfully placed.  Post intervention, there is a 0% residual stenosis.  Post intervention, there is a 0% residual stenosis.  Post intervention, there is a 0% residual stenosis.  Kaylee Spencer is a 39 y.o. female  [id number] LOCATION:  FACILITY: [facility] PHYSICIAN: Quay Burow, M.D. 1981/09/03 DATE OF PROCEDURE:  08/03/2020 DATE OF DISCHARGE: CARDIAC CATHETERIZATION / PCI DES RCS/RI History obtained from chart review.Kaylee Spencer is a 39 y.o. female with no prior  cardiac history who is being evaluted for chest pain.  Her EKG showed no acute changes.  2D echo was normal.  Her enzymes were mildly elevated at 300.  She became pain-free and was on IV heparin.  She presents for urgent diagnostic cath to define her anatomy. PROCEDURE DESCRIPTION: The patient was brought to the second floor [place] [facility]iac cath lab in the postabsorptive state.  She was premedicated with IV Versed and fentanyl.  Her right wristwas prepped and shaved in usual sterile fashion. Xylocaine 1% was used for local anesthesia. A 6 French sheath was inserted into the right radial artery using standard Seldinger technique.  A 5 [place] TIG catheter, and left Judkins catheter were used for selective coronary angiography and obtain left heart pressures.  Isovue dye was used for the entirety of the case.  Retrograde aorta, ventricular and pullback pressures were recorded.  Radial cocktail was administered via the SideArm sheath.  The patient received 45 minutes of heparin intravenously. She received an additional 7000's of heparin with an ACT of approximately 300.  She received Brilinta 180 mg p.o.  I initially focused on the ramus branch.  Using a 6 [place] XB 3.0 cm guide catheter along with a 0.14 Prowater guidewire and a 2 mm x 12 mm balloon I predilated the high-grade proximal ramus branch stenosis.  Following this I carefully positioned a 2.5 mm x 26 mm long resolute Onyx drug-eluting stent from the origin of the ramus branch across the tightest lesion and to the segmental disease just beyond I deployed at 14 atm.  I postdilated the stented segment with a 2.75 mm x 20 mm long noncompliant balloon up to 16 atm (2.83 mm) resulting reduction of a 95% fairly focal proximal ramus branch stenosis in the middle middle of moderate segmental disease to 0% residual with excellent flow. Following this I focused my attention on the mid RCA.  Using a 6 [place] JR4 guide catheter, the same 1 4 Prowater guide wire and a  2.25 x 16 mm long Synergy drug-eluting stent I primarily stented the mid RCA lesion up to 14 atm.  Then postdilated with a 2.5 x 12 mm long noncompliant balloon up to 16 atm (2.7 mm) resulting reduction of an 80% fairly focal mid RCA stenosis to 0% residual.  Patient tolerated procedure well.  The guidewire and catheter was removed.  A TR band was placed on the right wrist to achieve patent hemostasis.  The patient left lab in stable condition.   Successful PCI drug-eluting stenting of a high-grade proximal ramus branch stenosis and a mid codominant RCA stenosis using drug-eluting stents.  Patient will need at least 12 months of uninterrupted DAPT as  well as guideline directed optimal medical therapy including beta-blocker, high-dose statin therapy plus or minus an ACE inhibitor. Kaylee Spencer. Spencer, [facility] 08/03/2020 7:10 PM   ECHOCARDIOGRAM COMPLETE  Result Date: 08/03/2020    ECHOCARDIOGRAM REPORT   Patient Name:   Kaylee Spencer Date of Exam: 08/03/2020 Medical Rec #:  [id number]          Height:       65.0 in Accession #:    [id number]         Weight:       187.0 lb Date of Birth:  1981/06/23          BSA:          1.922 m Patient Age:    39 years           BP:           139/88 mmHg Patient Gender: F                  HR:           91 bpm. Exam Location:  Inpatient Procedure: 2D Echo, Cardiac Doppler, Color Doppler and Intracardiac            Opacification Agent Indications:    Chest pain  History:        Patient has no prior history of Echocardiogram examinations.                 Signs/Symptoms:Chest Pain. Elevated troponin.  Sonographer:    Lavenia Atlas Referring Phys: [phone] CADENCE H FURTH IMPRESSIONS  1. Left ventricular ejection fraction, by estimation, is 60 to 65%. The left ventricle has normal function. The left ventricle has no regional wall motion abnormalities. Left ventricular diastolic parameters were normal.  2. Right ventricular systolic function is normal. The right ventricular size is  normal.  3. The mitral valve is normal in structure. No evidence of mitral valve regurgitation. No evidence of mitral stenosis.  4. The aortic valve is normal in structure. Aortic valve regurgitation is not visualized. No aortic stenosis is present.  5. The inferior vena cava is normal in size with greater than 50% respiratory variability, suggesting right atrial pressure of 3 mmHg. FINDINGS  Left Ventricle: Left ventricular ejection fraction, by estimation, is 60 to 65%. The left ventricle has normal function. The left ventricle has no regional wall motion abnormalities. The left ventricular internal cavity size was normal in size. There is  no left ventricular hypertrophy. Left ventricular diastolic parameters were normal. Right Ventricle: The right ventricular size is normal. No increase in right ventricular wall thickness. Right ventricular systolic function is normal. Left Atrium: Left atrial size was normal in size. Right Atrium: Right atrial size was normal in size. Pericardium: There is no evidence of pericardial effusion. Mitral Valve: The mitral valve is normal in structure. Normal mobility of the mitral valve leaflets. No evidence of mitral valve regurgitation. No evidence of mitral valve stenosis. Tricuspid Valve: The tricuspid valve is normal in structure. Tricuspid valve regurgitation is not demonstrated. No evidence of tricuspid stenosis. Aortic Valve: The aortic valve is normal in structure. Aortic valve regurgitation is not visualized. No aortic stenosis is present. Pulmonic Valve: The pulmonic valve was normal in structure. Pulmonic valve regurgitation is not visualized. No evidence of pulmonic stenosis. Aorta: The aortic root is normal in size and structure. Venous: The inferior vena cava is normal in size with greater than 50% respiratory variability, suggesting right atrial pressure of 3 mmHg. IAS/Shunts: No  atrial level shunt detected by color flow Doppler.  LEFT VENTRICLE PLAX 2D LVIDd:          4.20 cm  Diastology LVIDs:         2.90 cm  LV e' lateral:   10.20 cm/s LV PW:         1.20 cm  LV E/e' lateral: 9.3 LV IVS:        1.10 cm  LV e' medial:    8.49 cm/s LVOT diam:     2.00 cm  LV E/e' medial:  11.2 LV SV:         52 LV SV Index:   27 LVOT Area:     3.14 cm  RIGHT VENTRICLE RV Basal diam:  2.90 cm RV S prime:     11.50 cm/s TAPSE (M-mode): 2.1 cm LEFT ATRIUM             Index       RIGHT ATRIUM           Index LA diam:        3.40 cm 1.77 cm/m  RA Area:     11.50 cm LA Vol (A2C):   32.0 ml 16.65 ml/m RA Volume:   24.30 ml  12.64 ml/m LA Vol (A4C):   34.8 ml 18.10 ml/m LA Biplane Vol: 35.5 ml 18.47 ml/m  AORTIC VALVE LVOT Vmax:   92.70 cm/s LVOT Vmean:  55.300 cm/s LVOT VTI:    0.167 m  AORTA Ao Root diam: 2.40 cm MITRAL VALVE MV Area (PHT): 3.85 cm    SHUNTS MV Decel Time: 197 msec    Systemic VTI:  0.17 m MV E velocity: 95.10 cm/s  Systemic Diam: 2.00 cm MV A velocity: 95.10 cm/s MV E/A ratio:  1.00 Kaylee Spencer Electronically signed by Kaylee Spencer Signature Date/Time: 08/03/2020/3:54:59 PM    Final    Disposition   Pt is being discharged home today in good condition.  Follow-up Plans & Appointments    Follow-up Information    O'Neal, Cassie Freer, [facility] Follow up.   Specialties: Internal Medicine, Cardiology, Radiology Why: The office will call with the date and time of appointment. Should be next week with Dr. Tyrone Schimke information: [place] [id number] [phone]              Discharge Instructions    Amb Referral to Cardiac Rehabilitation   Complete by: As directed    Diagnosis:  Coronary Stents NSTEMI PTCA     After initial evaluation and assessments completed: Virtual Based Care may be provided alone or in conjunction with Phase 2 Cardiac Rehab based on patient barriers.: Yes   Call [facility] for:  difficulty breathing, headache or visual disturbances   Complete by: As directed    Call [facility] for:  extreme fatigue   Complete by: As  directed    Call [facility] for:  hives   Complete by: As directed    Call [facility] for:  persistant dizziness or light-headedness   Complete by: As directed    Call [facility] for:  persistant nausea and vomiting   Complete by: As directed    Call [facility] for:  redness, tenderness, or signs of infection (pain, swelling, redness, odor or green/yellow discharge around incision site)   Complete by: As directed    Call [facility] for:  severe uncontrolled pain   Complete by: As directed    Call [facility] for:  temperature >100.4   Complete  by: As directed    Diet - low sodium heart healthy   Complete by: As directed    Discharge instructions   Complete by: As directed    No driving for 3-5 days. No lifting over 5 lbs for 1 week. No sexual activity for 1 week. Keep procedure site clean & dry. If you notice increased pain, swelling, bleeding or pus, call/return!  You may shower, but no soaking baths/hot tubs/pools for 1 week.   PLEASE DO NOT MISS ANY DOSES OF YOUR BRILINTA!!!!! Also keep a log of you blood pressures and bring back to your follow up appt. Please call the office with any questions.   Patients taking blood thinners should generally stay away from medicines like ibuprofen, Advil, Motrin, naproxen, and Aleve due to risk of stomach bleeding. You may take Tylenol as directed or talk to your primary doctor about alternatives.   Increase activity slowly   Complete by: As directed      Discharge Medications   Allergies as of 08/04/2020   No Known Allergies     Medication List    TAKE these medications   aspirin 81 MG chewable tablet Chew 1 tablet (81 mg total) by mouth daily. Start taking on: August 05, 2020   atorvastatin 80 MG tablet Commonly known as: LIPITOR Take 1 tablet (80 mg total) by mouth daily. Start taking on: August 05, 2020   chlorhexidine 4 % external liquid Commonly known as: Hibiclens Apply topically daily as needed.   losartan 25 MG tablet Commonly known as: COZAAR Take 1 tablet (25 mg  total) by mouth daily. Start taking on: August 05, 2020   metoprolol tartrate 25 MG tablet Commonly known as: LOPRESSOR Take 0.5 tablets (12.5 mg total) by mouth 2 (two) times daily.   nitroGLYCERIN 0.4 MG SL tablet Commonly known as: NITROSTAT Place 1 tablet (0.4 mg total) under the tongue every 5 (five) minutes as needed for chest pain (CP or SOB).   omeprazole 20 MG capsule Commonly known as: PRILOSEC Take 1 capsule (20 mg total) by mouth daily.   ondansetron 4 MG tablet Commonly known as: Zofran Take 1 tablet (4 mg total) by mouth every 8 (eight) hours as needed for nausea or vomiting.   ticagrelor 90 MG Tabs tablet Commonly known as: BRILINTA Take 1 tablet (90 mg total) by mouth 2 (two) times daily.       Outstanding Labs/Studies   BMET Lipid panel/LFTs in 6-8 weeks   Duration of Discharge Encounter   Greater than 30 minutes including physician time.  Signed, Kathyrn Drown, NP 08/04/2020, 10:05 AM

## 2020-08-04 NOTE — Discharge Instructions (Signed)
Coronary Angiogram With Stent Coronary angiogram with stent placement is a procedure to widen or open a narrow blood vessel of the heart (coronary artery). Arteries may become blocked by cholesterol buildup (plaques) in the lining of the artery wall. When a coronary artery becomes partially blocked, blood flow to that area decreases. This may lead to chest pain or a heart attack (myocardial infarction). A stent is a small piece of metal that looks like mesh or spring. Stent placement may be done as treatment after a heart attack, or to prevent a heart attack if a blocked artery is found by a coronary angiogram. Let your health care provider know about:  Any allergies you have, including allergies to medicines or contrast dye.  All medicines you are taking, including vitamins, herbs, eye drops, creams, and over-the-counter medicines.  Any problems you or family members have had with anesthetic medicines.  Any blood disorders you have.  Any surgeries you have had.  Any medical conditions you have, including kidney problems or kidney failure.  Whether you are pregnant or may be pregnant.  Whether you are breastfeeding. What are the risks? Generally, this is a safe procedure. However, serious problems may occur, including:  Damage to nearby structures or organs, such as the heart, blood vessels, or kidneys.  A return of blockage.  Bleeding, infection, or bruising at the insertion site.  A collection of blood under the skin (hematoma) at the insertion site.  A blood clot in another part of the body.  Allergic reaction to medicines or dyes.  Bleeding into the abdomen (retroperitoneal bleeding).  Stroke (rare).  Heart attack (rare). What happens before the procedure? Staying hydrated Follow instructions from your health care provider about hydration, which may include:  Up to 2 hours before the procedure - you may continue to drink clear liquids, such as water, clear fruit juice,  black coffee, and plain tea.  Eating and drinking restrictions Follow instructions from your health care provider about eating and drinking, which may include:  8 hours before the procedure - stop eating heavy meals or foods, such as meat, fried foods, or fatty foods.  6 hours before the procedure - stop eating light meals or foods, such as toast or cereal.  2 hours before the procedure - stop drinking clear liquids. Medicines Ask your health care provider about:  Changing or stopping your regular medicines. This is especially important if you are taking diabetes medicines or blood thinners.  Taking medicines such as aspirin and ibuprofen. These medicines can thin your blood. Do not take these medicines unless your health care provider tells you to take them. ? Generally, aspirin is recommended before a thin tube, called a catheter, is passed through a blood vessel and inserted into the heart (cardiac catheterization).  Taking over-the-counter medicines, vitamins, herbs, and supplements. General instructions  Do not use any products that contain nicotine or tobacco for at least 4 weeks before the procedure. These products include cigarettes, e-cigarettes, and chewing tobacco. If you need help quitting, ask your health care provider.  Plan to have someone take you home from the hospital or clinic.  If you will be going home right after the procedure, plan to have someone with you for 24 hours.  You may have tests and imaging procedures.  Ask your health care provider: ? How your insertion site will be marked. Ask which artery will be used for the procedure. ? What steps will be taken to help prevent infection. These may include:    Removing hair at the insertion site.  Washing skin with a germ-killing soap.  Taking antibiotic medicine. What happens during the procedure?   An IV will be inserted into one of your veins.  Electrodes may be placed on your chest to monitor your  heart rate during the procedure.  You will be given one or more of the following: ? A medicine to help you relax (sedative). ? A medicine to numb the area (local anesthetic) for catheter insertion.  A small incision will be made for catheter insertion.  The catheter will be inserted into an artery using a guide wire. The location may be in your groin, your wrist, or the fold of your arm (near your elbow).  An X-ray procedure (fluoroscopy) will be used to help guide the catheter to the opening of the heart arteries.  A dye will be injected into the catheter. X-rays will be taken. The dye helps to show where any narrowing or blockages are located in the arteries.  Tell your health care provider if you have chest pain or trouble breathing.  A tiny wire will be guided to the blocked spot, and a balloon will be inflated to make the artery wider.  The stent will be expanded to crush the plaques into the wall of the vessel. The stent will hold the area open and improve the blood flow. Most stents have a drug coating to reduce the risk of the stent narrowing over time.  The artery may be made wider using a drill, laser, or other tools that remove plaques.  The catheter will be removed when the blood flow improves. The stent will stay where it was placed, and the lining of the artery will grow over it.  A bandage (dressing) will be placed on the insertion site. Pressure will be applied to stop bleeding.  The IV will be removed. This procedure may vary among health care providers and hospitals. What happens after the procedure?  Your blood pressure, heart rate, breathing rate, and blood oxygen level will be monitored until you leave the hospital or clinic.  If the procedure is done through the leg, you will lie flat in bed for a few hours or for as long as told by your health care provider. You will be instructed not to bend or cross your legs.  The insertion site and the pulse in your foot  or wrist will be checked often.  You may have more blood tests, X-rays, and a test that records the electrical activity of your heart (electrocardiogram, or ECG).  Do not drive for 24 hours if you were given a sedative during your procedure. Summary  Coronary angiogram with stent placement is a procedure to widen or open a narrowed coronary artery. This is done to treat heart problems.  Before the procedure, let your health care provider know about all the medical conditions and surgeries you have or have had.  This is a safe procedure. However, some problems may occur, including damage to nearby structures or organs, bleeding, blood clots, or allergies.  Follow your health care provider's instructions about eating, drinking, medicines, and other lifestyle changes, such as quitting tobacco use before the procedure. This information is not intended to replace advice given to you by your health care provider. Make sure you discuss any questions you have with your health care provider. Document Revised: 06/26/2019 Document Reviewed: 06/26/2019 Elsevier Patient Education  2020 Elsevier Inc.   Ticagrelor oral tablet What is this medicine? TICAGRELOR (TYE  ka GREL or) helps to prevent blood clots. This medicine is used to prevent heart attack, stroke, or other vascular events in people who have had a recent heart attack or who have severe chest pain. It is also used to lower the chance of stroke or heart attack in people with a medical condition called coronary artery disease. This medicine may be used for other purposes; ask your health care provider or pharmacist if you have questions. COMMON BRAND NAME(S): BRILINTA What should I tell my health care provider before I take this medicine? They need to know if you have any of these conditions:  bleeding disorders  bleeding in the brain  having surgery  history of irregular heartbeat  history of stomach bleeding  liver disease  an  unusual or allergic reaction to ticagrelor, other medicines, foods, dyes, or preservatives  pregnant or trying to get pregnant  breast-feeding How should I use this medicine? Take this medicine by mouth with a glass of water. Follow the directions on the prescription label. You can take it with or without food. If it upsets your stomach, take it with food. Take your medicine at regular intervals. Do not take it more often than directed. Do not stop taking except on your doctor's advice. A special MedGuide will be given to you by the pharmacist with each prescription and refill. Be sure to read this information carefully each time. Talk to your pediatrician regarding the use of this medicine in children. Special care may be needed. Overdosage: If you think you have taken too much of this medicine contact a poison control center or emergency room at once. NOTE: This medicine is only for you. Do not share this medicine with others. What if I miss a dose? If you miss a dose, take it as soon as you can. If it is almost time for your next dose, take only that dose. Do not take double or extra doses. What may interact with this medicine? Do not take this medicine with any of the following medications:  defibrotide  itraconazole This medicine may also interact with the following medications:  aspirin  certain antibiotics like clarithromycin, telithromycin, and rifampin  certain antiviral medicines for HIV or AIDS like atazanavir, indinavir, nelfinavir, ritonavir, and saquinavir  certain medicines for cholesterol like lovastatin and simvastatin  certain medicines for fungal infections like ketoconazole and voriconazole  certain medicines for seizures like carbamazepine, phenobarbital, and phenytoin  digoxin  narcotic medicines for pain  nefazodone This list may not describe all possible interactions. Give your health care provider a list of all the medicines, herbs, non-prescription  drugs, or dietary supplements you use. Also tell them if you smoke, drink alcohol, or use illegal drugs. Some items may interact with your medicine. What should I watch for while using this medicine? Visit your healthcare professional for regular checks on your progress. Your condition will be monitored carefully while you are receiving this medicine. It is important not to miss any appointments. Notify your doctor or health care professional and seek emergency treatment if you develop breathing problems; changes in vision; chest pain; severe, sudden headache; pain, swelling, warmth in the leg; trouble speaking; sudden numbness or weakness of the face, arm, or leg. These can be signs that your condition has gotten worse. If you are going to have surgery or dental work, tell your doctor or health care professional that you are taking this medicine. You should take aspirin every day with this medicine. Do not take more  than 100 mg each day. Talk to your healthcare professional if you have questions. What side effects may I notice from receiving this medicine? Side effects that you should report to your doctor or health care professional as soon as possible:  allergic reactions like skin rash, itching or hives, swelling of the face, lips, or tongue  breathing problems  fast or irregular heartbeat  feeling faint or light-headed, falls  signs and symptoms of bleeding such as bloody or black, tarry stools; red or dark-brown urine; spitting up blood or brown material that looks like coffee grounds; red spots on the skin; unusual bruising or bleeding from the eye, gums, or nose Side effects that usually do not require medical attention (report to your doctor or health care professional if they continue or are bothersome):  breast enlargement in both males and females  diarrhea  dizziness  headache  tiredness  upset stomach This list may not describe all possible side effects. Call your doctor  for medical advice about side effects. You may report side effects to FDA at 1-800-FDA-1088. Where should I keep my medicine? Keep out of the reach of children. Store at room temperature of 59 to 86 degrees F (15 to 30 degrees C). Throw away any unused medicine after the expiration date. NOTE: This sheet is a summary. It may not cover all possible information. If you have questions about this medicine, talk to your doctor, pharmacist, or health care provider.  2020 Elsevier/Gold Standard (2019-05-20 21:21:41) Radial Site Care  This sheet gives you information about how to care for yourself after your procedure. Your health care provider may also give you more specific instructions. If you have problems or questions, contact your health care provider. What can I expect after the procedure? After the procedure, it is common to have:  Bruising and tenderness at the catheter insertion area. Follow these instructions at home: Medicines  Take over-the-counter and prescription medicines only as told by your health care provider. Insertion site care  Follow instructions from your health care provider about how to take care of your insertion site. Make sure you: ? Wash your hands with soap and water before you change your bandage (dressing). If soap and water are not available, use hand sanitizer. ? Change your dressing as told by your health care provider. ? Leave stitches (sutures), skin glue, or adhesive strips in place. These skin closures may need to stay in place for 2 weeks or longer. If adhesive strip edges start to loosen and curl up, you may trim the loose edges. Do not remove adhesive strips completely unless your health care provider tells you to do that.  Check your insertion site every day for signs of infection. Check for: ? Redness, swelling, or pain. ? Fluid or blood. ? Pus or a bad smell. ? Warmth.  Do not take baths, swim, or use a hot tub until your health care provider  approves.  You may shower 24-48 hours after the procedure, or as directed by your health care provider. ? Remove the dressing and gently wash the site with plain soap and water. ? Pat the area dry with a clean towel. ? Do not rub the site. That could cause bleeding.  Do not apply powder or lotion to the site. Activity   For 24 hours after the procedure, or as directed by your health care provider: ? Do not flex or bend the affected arm. ? Do not push or pull heavy objects with the affected  arm. ? Do not drive yourself home from the hospital or clinic. You may drive 24 hours after the procedure unless your health care provider tells you not to. ? Do not operate machinery or power tools.  Do not lift anything that is heavier than 10 lb (4.5 kg), or the limit that you are told, until your health care provider says that it is safe.  Ask your health care provider when it is okay to: ? Return to work or school. ? Resume usual physical activities or sports. ? Resume sexual activity. General instructions  If the catheter site starts to bleed, raise your arm and put firm pressure on the site. If the bleeding does not stop, get help right away. This is a medical emergency.  If you went home on the same day as your procedure, a responsible adult should be with you for the first 24 hours after you arrive home.  Keep all follow-up visits as told by your health care provider. This is important. Contact a health care provider if:  You have a fever.  You have redness, swelling, or yellow drainage around your insertion site. Get help right away if:  You have unusual pain at the radial site.  The catheter insertion area swells very fast.  The insertion area is bleeding, and the bleeding does not stop when you hold steady pressure on the area.  Your arm or hand becomes pale, cool, tingly, or numb. These symptoms may represent a serious problem that is an emergency. Do not wait to see if the  symptoms will go away. Get medical help right away. Call your local emergency services (911 in the [place] Do not drive yourself to the hospital. Summary  After the procedure, it is common to have bruising and tenderness at the site.  Follow instructions from your health care provider about how to take care of your radial site wound. Check the wound every day for signs of infection.  Do not lift anything that is heavier than 10 lb (4.5 kg), or the limit that you are told, until your health care provider says that it is safe. This information is not intended to replace advice given to you by your health care provider. Make sure you discuss any questions you have with your health care provider. Document Revised: 01/10/2018 Document Reviewed: 01/10/2018 Elsevier Patient Education  2020 [employer].

## 2020-08-04 NOTE — TOC Benefit Eligibility Note (Signed)
Transition of Care [facility]) Benefit Eligibility Note    Patient Details  Name: Thaily Hackworth MRN: [id number] Date of Birth: 01/26/1981   Medication/Dose: BRILINTA  90 MG BID  Covered?: Yes  Tier: 3 Drug  Prescription Coverage Preferred Pharmacy: [facility] [place] with Person/Company/Phone Number:: [facility]  @ [employer] [facility] # [phone]  Co-Pay: $ 100.00     Deductible: Met (OUT-OF-POCKET:UNMET)       Memory Argue Phone Number: 08/04/2020, 1:35 PM

## 2020-08-10 ENCOUNTER — Telehealth (HOSPITAL_COMMUNITY): Payer: Self-pay

## 2020-08-10 NOTE — Telephone Encounter (Signed)
Called patient to see if she is interested in the Cardiac Rehab Program. Patient expressed interest. Explained scheduling process and went over insurance, patient verbalized understanding. Will contact patient for scheduling once f/u has been completed. 

## 2020-08-10 NOTE — Telephone Encounter (Signed)
Pt insurance is active and benefits verified through [employer]. Co-pay $0.00, DED $2,500.00/$196.66 met, out of pocket $6,000.00/$229.71 met, co-insurance 30%. No pre-authorization required. [employer]port, 08/10/20 @ 10:34AM, REF#[id number] ° °Will contact patient to see if she is interested in the Cardiac Rehab Program. If interested, patient will need to complete follow up appt. Once completed, patient will be contacted for scheduling upon review by the RN Navigator. °

## 2020-08-12 NOTE — Progress Notes (Signed)
Cardiology Office Note:   Date:  08/13/2020  NAME:  Kaylee Spencer    MRN: [id number] DOB:  16-Jul-1981   PCP:  Derrel Nip, MD  Cardiologist:  Reatha Harps, MD   Referring MD: Derrel Nip, MD   Chief Complaint  Patient presents with  . Coronary Artery Disease   History of Present Illness:   Kaylee Spencer is a 39 y.o. female with a hx of CAD, HTN who presents for the evaluation of CAD at the request of Derrel Nip, MD.  She was in the hospital last week with non-STEMI.  Underwent drug-eluting stent to the ramus and RCA.  She reports she is been extremely anxious since her intervention.  She reports is not sleeping well.  She is had no further episodes of chest pain or shortness of breath.  She is tolerating her medications well.  She is not missing any doses of her aspirin or Brilinta.  Her blood pressure is well controlled.  She is tolerating her statin well.  We did discuss sleep hygiene measures.  I also recommended trazodone.  She reports she feels as if she almost has a PTSD-like event.  This is not uncommon especially in women.  She reports she has exercising 15 minutes 3 to 5 days/week.  This includes yoga.  I have asked her to incorporate moderate to vigorous physical activity.  Walking 30 minutes 5 days/week would be great.  We did discuss reducing red meat consumption and increasing green leafy vegetables.  Her right radial cath site is clean and dry.  She had no issues.  She has started back driving which is okay.  She will return to work.  We do need to get her LDL cholesterol under control.  We have plans to recheck one in a few months.  Problem List 1. CAD -NSTEMI 08/03/2020 -DES to ramus and RCA 2. HTN 3. HLD -T chol 196, HDL 38, LDL 128, TG 149  Past Medical History: Past Medical History:  Diagnosis Date  . Anxiety   . Coronary artery disease   . Headache(784.0)   . 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  Hypertension   . Migraine headache with aura     Past Surgical History: Past Surgical History:  Procedure Laterality Date  . boil removal    . CORONARY STENT INTERVENTION N/A 08/03/2020   Procedure: CORONARY STENT INTERVENTION;  Surgeon: Runell Gess, MD;  Location: [facility];  Service: Cardiovascular;  Laterality: N/A;  . DILATION AND CURETTAGE OF UTERUS    . LEFT HEART CATH AND CORONARY ANGIOGRAPHY N/A 08/03/2020   Procedure: LEFT HEART CATH AND CORONARY ANGIOGRAPHY;  Surgeon: Runell Gess, MD;  Location: [facility];  Service: Cardiovascular;  Laterality: N/A;  . tooth extraction Bilateral     Current Medications: Current Meds  Medication Sig  . aspirin 81 MG chewable tablet Chew 1 tablet (81 mg total) by mouth daily.  Marland Kitchen atorvastatin (LIPITOR) 80 MG tablet Take 1 tablet (80 mg total) by mouth daily.  Marland Kitchen losartan (COZAAR) 25 MG tablet Take 1 tablet (25 mg total) by mouth daily.  . metoprolol tartrate (LOPRESSOR) 25 MG tablet Take 0.5 tablets (12.5 mg total) by mouth 2 (two) times daily.  . nitroGLYCERIN (NITROSTAT) 0.4 MG SL tablet Place 1 tablet (0.4 mg total) under the tongue every 5 (five) minutes as needed for chest pain (CP or SOB).  . ticagrelor (BRILINTA) 90 MG TABS tablet Take 1 tablet (90  mg total) by mouth 2 (two) times daily.  . [DISCONTINUED] aspirin 81 MG chewable tablet Chew 1 tablet (81 mg total) by mouth daily.  . [DISCONTINUED] atorvastatin (LIPITOR) 80 MG tablet Take 1 tablet (80 mg total) by mouth daily.  . [DISCONTINUED] chlorhexidine (HIBICLENS) 4 % external liquid Apply topically daily as needed.  . [DISCONTINUED] losartan (COZAAR) 25 MG tablet Take 1 tablet (25 mg total) by mouth daily.  . [DISCONTINUED] metoprolol tartrate (LOPRESSOR) 25 MG tablet Take 0.5 tablets (12.5 mg total) by mouth 2 (two) times daily.  . [DISCONTINUED] nitroGLYCERIN (NITROSTAT) 0.4 MG SL tablet Place 1 tablet (0.4 mg total) under the tongue every 5 (five) minutes  as needed for chest pain (CP or SOB).  . [DISCONTINUED] omeprazole (PRILOSEC) 20 MG capsule Take 1 capsule (20 mg total) by mouth daily.  . [DISCONTINUED] ondansetron (ZOFRAN) 4 MG tablet Take 1 tablet (4 mg total) by mouth every 8 (eight) hours as needed for nausea or vomiting.  . [DISCONTINUED] ticagrelor (BRILINTA) 90 MG TABS tablet Take 1 tablet (90 mg total) by mouth 2 (two) times daily.     Allergies:    Patient has no known allergies.   Social History: Social History   Socioeconomic History  . Marital status: Single    Spouse name: Not on file  . Number of children: Not on file  . Years of education: Not on file  . Highest education level: Not on file  Occupational History  . Occupation: Teacher, English as a foreign language  Tobacco Use  . Smoking status: Former Smoker    Quit date: 04/22/1998    Years since quitting: 22.3  . Smokeless tobacco: Never Used  Vaping Use  . Vaping Use: Never used  Substance and Sexual Activity  . Alcohol use: Yes    Comment: occassional  . Drug use: No    Comment: as teenager  . Sexual activity: Yes    Birth control/protection: None  Other Topics Concern  . Not on file  Social History Narrative  . Not on file   Social Determinants of Health   Financial Resource Strain:   . Difficulty of Paying Living Expenses: Not on file  Food Insecurity:   . Worried About Programme researcher, broadcasting/film/video in the Last Year: Not on file  . Ran Out of Food in the Last Year: Not on file  Transportation Needs:   . Lack of Transportation (Medical): Not on file  . Lack of Transportation (Non-Medical): Not on file  Physical Activity:   . Days of Exercise per Week: Not on file  . Minutes of Exercise per Session: Not on file  Stress:   . Feeling of Stress : Not on file  Social Connections:   . Frequency of Communication with Friends and Family: Not on file  . Frequency of Social Gatherings with Friends and Family: Not on file  . Attends Religious Services: Not on file  . Active  Member of Clubs or Organizations: Not on file  . Attends Banker Meetings: Not on file  . Marital Status: Not on file     Family History: The patient's family history includes Anxiety disorder in her brother and sister; Glaucoma in her father; Hypertension in her mother; Stroke in her father. There is no history of Anesthesia problems.  ROS:   All other ROS reviewed and negative. Pertinent positives noted in the HPI.     EKGs/Labs/Other Studies Reviewed:   The following studies were personally reviewed by me today:  EKG:  EKG is ordered today.  The ekg ordered today demonstrates normal sinus rhythm, heart rate 81, LVH by voltage, and was personally reviewed by me.   TTE 08/03/2020 1. Left ventricular ejection fraction, by estimation, is 60 to 65%. The  left ventricle has normal function. The left ventricle has no regional  wall motion abnormalities. Left ventricular diastolic parameters were  normal.  2. Right ventricular systolic function is normal. The right ventricular  size is normal.  3. The mitral valve is normal in structure. No evidence of mitral valve  regurgitation. No evidence of mitral stenosis.  4. The aortic valve is normal in structure. Aortic valve regurgitation is  not visualized. No aortic stenosis is present.  5. The inferior vena cava is normal in size with greater than 50%  respiratory variability, suggesting right atrial pressure of 3 mmHg.   LHC 08/03/2020  Prox RCA to Mid RCA lesion is 80% stenosed.  Ramus-1 lesion is 70% stenosed.  Ramus-2 lesion is 95% stenosed.  Ramus-3 lesion is 70% stenosed.  A drug-eluting stent was successfully placed using a STENT RESOLUTE ONYX 2.5X26.  Post intervention, there is a 0% residual stenosis.  A drug-eluting stent was successfully placed.  Post intervention, there is a 0% residual stenosis.  Post intervention, there is a 0% residual stenosis.  Post intervention, there is a 0% residual  stenosis.   Recent Labs: 08/03/2020: TSH 0.762 08/04/2020: BUN 8; Creatinine, Ser 0.75; Hemoglobin 12.4; Platelets 283; Potassium 3.4; Sodium 137   Recent Lipid Panel    Component Value Date/Time   CHOL 196 08/04/2020 0344   TRIG 149 08/04/2020 0344   HDL 38 (L) 08/04/2020 0344   CHOLHDL 5.2 08/04/2020 0344   VLDL 30 08/04/2020 0344   LDLCALC 128 (H) 08/04/2020 0344    Physical Exam:   VS:  BP 130/80 (BP Location: Left Arm, Patient Position: Sitting, Cuff Size: Normal)   Pulse 81   Ht 5\' 5"  (1.651 m)   Wt 180 lb (81.6 kg)   BMI 29.95 kg/m    Wt Readings from Last 3 Encounters:  08/13/20 180 lb (81.6 kg)  08/04/20 185 lb 14.4 oz (84.3 kg)  04/15/20 188 lb 9.6 oz (85.5 kg)    General: Well nourished, well developed, in no acute distress Heart: Atraumatic, normal size  Eyes: PEERLA, EOMI  Neck: Supple, no JVD Endocrine: No thryomegaly Cardiac: Normal S1, S2; RRR; no murmurs, rubs, or gallops Lungs: Clear to auscultation bilaterally, no wheezing, rhonchi or rales  Abd: Soft, nontender, no hepatomegaly  Ext: No edema, pulses 2+, R radial cath site clean and dry Musculoskeletal: No deformities, BUE and BLE strength normal and equal Skin: Warm and dry, no rashes   Neuro: Alert and oriented to person, place, time, and situation, CNII-XII grossly intact, no focal deficits  Psych: Normal mood and affect   ASSESSMENT:   Kaylee Spencer is a 39 y.o. female who presents for the following: 1. Coronary artery disease involving native coronary artery of native heart without angina pectoris   2. Mixed hyperlipidemia   3. Essential hypertension   4. Primary insomnia     PLAN:   1. Coronary artery disease involving native coronary artery of native heart without angina pectoris -Non-STEMI 08/03/2020.  Status post drug-eluting stent to ramus intermedius as well as RCA.  She is not having any chest pain episodes.  Symptoms have resolved. -She will continue 1 year of aspirin and  Brilinta. -She remains on metoprolol. -She will continue Lipitor 80 mg  daily. -Echocardiogram shows normal LV function. -Overall we are in secondary prevention measures.  We will work to get her LDL cholesterol lower.  Her blood pressure is 130/80 today.  This is acceptable. -She is not on birth control.  We did discuss that she cannot get pregnant.  She reports this will not be an issue.  She will let us know should there be any issue.  I have recommended she reach out to GYN to consider birth control.    2. Mixed hyperlipidemia -Continue Lipitor 80 mg daily.  I will see her back in 3 months.  I want a fasting lipid profile 1 week before that appointment.  3. Essential hypertension -Blood pressure acceptable 130/80.  She is on metoprolol and losartan.  4. Primary insomnia -She is having issues sleeping.  I recommended good sleep hygiene.  We will start trazodone 25 mg nightly.  Disposition: Return in about 3 months (around 11/13/2020).  Medication Adjustments/Labs and Tests Ordered: Current medicines are reviewed at length with the patient today.  Concerns regarding medicines are outlined above.  Orders Placed This Encounter  Procedures  . Lipid panel  . EKG 12-Lead   Meds ordered this encounter  Medications  . ticagrelor (BRILINTA) 90 MG TABS tablet    Sig: Take 1 tablet (90 mg total) by mouth 2 (two) times daily.    Dispense:  120 tablet    Refill:  3  . nitroGLYCERIN (NITROSTAT) 0.4 MG SL tablet    Sig: Place 1 tablet (0.4 mg total) under the tongue every 5 (five) minutes as needed for chest pain (CP or SOB).    Dispense:  25 tablet    Refill:  0  . metoprolol tartrate (LOPRESSOR) 25 MG tablet    Sig: Take 0.5 tablets (12.5 mg total) by mouth 2 (two) times daily.    Dispense:  120 tablet    Refill:  3  . losartan (COZAAR) 25 MG tablet    Sig: Take 1 tablet (25 mg total) by mouth daily.    Dispense:  90 tablet    Refill:  3  . atorvastatin (LIPITOR) 80 MG tablet    Sig:  Take 1 tablet (80 mg total) by mouth daily.    Dispense:  90 tablet    Refill:  3  . aspirin 81 MG chewable tablet    Sig: Chew 1 tablet (81 mg total) by mouth daily.  . traZODone (DESYREL) 50 MG tablet    Sig: Take 0.5 tablets (25 mg total) by mouth at bedtime.    Dispense:  30 tablet    Refill:  2    Patient Instructions  Medication Instructions:  The current medical regimen is effective;  continue present plan and medications. Take Trazodone 25 mg every night at bedtime.  *If you need a refill on your cardiac medications before your next appointment, please call your pharmacy*   Lab Work: LIPID (1 week before 3 month follow up appointment, no lab appointment needed, come fasting nothing to eat or drink)   If you have labs (blood work) drawn today and your tests are completely normal, you will receive your results only by: Marland Kitchen MyChart Message (if you have MyChart) OR . A paper copy in the mail If you have any lab test that is abnormal or we need to change your treatment, we will call you to review the results.   Follow-Up: At [facility], you and your health needs are our priority.  As part of our  continuing mission to provide you with exceptional heart care, we have created designated Provider Care Teams.  These Care Teams include your primary Cardiologist (physician) and Advanced Practice Providers (APPs -  Physician Assistants and Nurse Practitioners) who all work together to provide you with the care you need, when you need it.  We recommend signing up for the patient portal called "MyChart".  Sign up information is provided on this After Visit Summary.  MyChart is used to connect with patients for Virtual Visits (Telemedicine).  Patients are able to view lab/test results, encounter notes, upcoming appointments, etc.  Non-urgent messages can be sent to your provider as well.   To learn more about what you can do with MyChart, go to [email or web address].    Your next  appointment:   3 month(s)  The format for your next appointment:   In Person  Provider:   Lennie Odor, MD        Signed, Lenna Gilford. Flora Lipps, MD [facility]  [address], [address] [place], [place] [address] [phone]  08/13/2020 3:02 PM

## 2020-08-13 ENCOUNTER — Other Ambulatory Visit: Payer: Self-pay

## 2020-08-13 ENCOUNTER — Encounter: Payer: Self-pay | Admitting: Cardiovascular Disease

## 2020-08-13 ENCOUNTER — Ambulatory Visit (INDEPENDENT_AMBULATORY_CARE_PROVIDER_SITE_OTHER): Payer: BC Managed Care – PPO | Admitting: Cardiovascular Disease

## 2020-08-13 VITALS — BP 130/80 | HR 81 | Ht 65.0 in | Wt 180.0 lb

## 2020-08-13 DIAGNOSIS — I2510 Atherosclerotic heart disease of native coronary artery without angina pectoris: Secondary | ICD-10-CM

## 2020-08-13 DIAGNOSIS — E782 Mixed hyperlipidemia: Secondary | ICD-10-CM

## 2020-08-13 DIAGNOSIS — I10 Essential (primary) hypertension: Secondary | ICD-10-CM

## 2020-08-13 DIAGNOSIS — F5101 Primary insomnia: Secondary | ICD-10-CM

## 2020-08-13 MED ORDER — ASPIRIN 81 MG PO CHEW: 81.0000 mg | CHEWABLE_TABLET | Freq: Every day | ORAL | Status: AC

## 2020-08-13 MED ORDER — NITROGLYCERIN 0.4 MG SL SUBL: 0.4000 mg | SUBLINGUAL_TABLET | SUBLINGUAL | 0 refills | Status: AC | PRN

## 2020-08-13 MED ORDER — TICAGRELOR 90 MG PO TABS: 90.0000 mg | ORAL_TABLET | Freq: Two times a day (BID) | ORAL | 3 refills | Status: DC

## 2020-08-13 MED ORDER — ATORVASTATIN CALCIUM 80 MG PO TABS: 80.0000 mg | ORAL_TABLET | Freq: Every day | ORAL | 3 refills | Status: DC

## 2020-08-13 MED ORDER — LOSARTAN POTASSIUM 25 MG PO TABS: 25.0000 mg | ORAL_TABLET | Freq: Every day | ORAL | 3 refills | Status: DC

## 2020-08-13 MED ORDER — METOPROLOL TARTRATE 25 MG PO TABS: 12.5000 mg | ORAL_TABLET | Freq: Two times a day (BID) | ORAL | 3 refills | Status: DC

## 2020-08-13 MED ORDER — TRAZODONE HCL 50 MG PO TABS: 25.0000 mg | ORAL_TABLET | Freq: Every day | ORAL | 2 refills | Status: DC

## 2020-08-13 NOTE — Patient Instructions (Addendum)
Medication Instructions:  The current medical regimen is effective;  continue present plan and medications. Take Trazodone 25 mg every night at bedtime.  *If you need a refill on your cardiac medications before your next appointment, please call your pharmacy*   Lab Work: LIPID (1 week before 3 month follow up appointment, no lab appointment needed, come fasting nothing to eat or drink)   If you have labs (blood work) drawn today and your tests are completely normal, you will receive your results only by: Marland Kitchen MyChart Message (if you have MyChart) OR . A paper copy in the mail If you have any lab test that is abnormal or we need to change your treatment, we will call you to review the results.   Follow-Up: At [facility], you and your health needs are our priority.  As part of our continuing mission to provide you with exceptional heart care, we have created designated Provider Care Teams.  These Care Teams include your primary Cardiologist (physician) and Advanced Practice Providers (APPs -  Physician Assistants and Nurse Practitioners) who all work together to provide you with the care you need, when you need it.  We recommend signing up for the patient portal called "MyChart".  Sign up information is provided on this After Visit Summary.  MyChart is used to connect with patients for Virtual Visits (Telemedicine).  Patients are able to view lab/test results, encounter notes, upcoming appointments, etc.  Non-urgent messages can be sent to your provider as well.   To learn more about what you can do with MyChart, go to [email or web address].    Your next appointment:   3 month(s)  The format for your next appointment:   In Person  Provider:   Lennie Odor, MD

## 2020-08-19 ENCOUNTER — Ambulatory Visit: Payer: BC Managed Care – PPO | Admitting: Physician Assistant

## 2020-08-27 ENCOUNTER — Telehealth (HOSPITAL_COMMUNITY): Payer: Self-pay

## 2020-08-27 ENCOUNTER — Encounter (HOSPITAL_COMMUNITY): Payer: Self-pay

## 2020-08-27 NOTE — Telephone Encounter (Signed)
Attempted to call patient in regards to Cardiac Rehab - LM on VM Mailed letter 

## 2020-09-10 ENCOUNTER — Telehealth (HOSPITAL_COMMUNITY): Payer: Self-pay

## 2020-09-10 NOTE — Telephone Encounter (Signed)
No response from pt regarding CR.  Closed referral.  

## 2020-11-15 NOTE — Progress Notes (Signed)
Cardiology Office Note:   Date:  11/17/2020  NAME:  Kaylee Spencer    MRN: [id number] DOB:  23-Jan-1981   PCP:  Derrel Nipresenzo, Victor, MD  Cardiologist:  Reatha HarpsWesley T O'Neal, MD   Referring MD: Derrel Nipresenzo, Victor, MD   Chief Complaint  Patient presents with  . Coronary Artery Disease   History of Present Illness:   Kaylee Spencer is a 39 y.o. female with a hx of CAD (NSTEMI 07/2020 with DES x 2), HTN, HLD who presents for follow-up. Needs repeat lipid. She reports she is doing well.  Reports some intermittent tightness in the chest that mainly occurs at work.  Can occur with stress.  Does not appear to occur with exertion.  Weights are stable around 185 pounds.  She started exercise and work on her diet.  Has not lost any weight but had the recent Thanksgiving holiday.  She is tolerating her medications without any major bleeding.  She does report menses are heavy.  She is not on birth control but has no plans to pursue pregnancy at any time.  She reports she is not sexually active.  I did stress the importance of this and she will need to let us know if things do change.  Her blood pressure is 110/73.  She needs a repeat lipid profile today.  She denies any chest pain, shortness of breath or palpitations in office today.  She does report some heavy menses I have recommended a CBC today as well.  She does not report any symptoms concerning for symptomatic anemia will make sure.  Problem List 1. CAD -NSTEMI 08/03/2020 -DES to ramus and RCA 2. HTN 3. HLD -T chol 196, HDL 38, LDL 128, TG 149  Past Medical History: Past Medical History:  Diagnosis Date  . Anxiety   . Coronary artery disease   . Headache(784.0)   . 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 Hypertension   . Migraine headache with aura     Past Surgical History: Past Surgical History:  Procedure Laterality Date  . boil removal    . CORONARY STENT INTERVENTION N/A 08/03/2020   Procedure: CORONARY  STENT INTERVENTION;  Surgeon: Runell GessBerry, Jonathan J, MD;  Location: [facility];  Service: Cardiovascular;  Laterality: N/A;  . DILATION AND CURETTAGE OF UTERUS    . LEFT HEART CATH AND CORONARY ANGIOGRAPHY N/A 08/03/2020   Procedure: LEFT HEART CATH AND CORONARY ANGIOGRAPHY;  Surgeon: Runell GessBerry, Jonathan J, MD;  Location: [facility];  Service: Cardiovascular;  Laterality: N/A;  . tooth extraction Bilateral     Current Medications: Current Meds  Medication Sig  . aspirin 81 MG chewable tablet Chew 1 tablet (81 mg total) by mouth daily.  Marland Kitchen. atorvastatin (LIPITOR) 80 MG tablet Take 1 tablet (80 mg total) by mouth daily.  Marland Kitchen. losartan (COZAAR) 25 MG tablet Take 1 tablet (25 mg total) by mouth daily.  . metoprolol tartrate (LOPRESSOR) 25 MG tablet Take 0.5 tablets (12.5 mg total) by mouth 2 (two) times daily.  . nitroGLYCERIN (NITROSTAT) 0.4 MG SL tablet Place 1 tablet (0.4 mg total) under the tongue every 5 (five) minutes as needed for chest pain (CP or SOB).  . ticagrelor (BRILINTA) 90 MG TABS tablet Take 1 tablet (90 mg total) by mouth 2 (two) times daily.  . traZODone (DESYREL) 50 MG tablet Take 0.5 tablets (25 mg total) by mouth at bedtime.     Allergies:    Patient has no known allergies.  Social History: Social History   Socioeconomic History  . Marital status: Single    Spouse name: Not on file  . Number of children: Not on file  . Years of education: Not on file  . Highest education level: Not on file  Occupational History  . Occupation: Teacher, English as a foreign language  Tobacco Use  . Smoking status: Former Smoker    Quit date: 04/22/1998    Years since quitting: 22.5  . Smokeless tobacco: Never Used  Vaping Use  . Vaping Use: Never used  Substance and Sexual Activity  . Alcohol use: Yes    Comment: occassional  . Drug use: No    Comment: as teenager  . Sexual activity: Yes    Birth control/protection: None  Other Topics Concern  . Not on file  Social History Narrative    . Not on file   Social Determinants of Health   Financial Resource Strain:   . Difficulty of Paying Living Expenses: Not on file  Food Insecurity:   . Worried About Programme researcher, broadcasting/film/video in the Last Year: Not on file  . Ran Out of Food in the Last Year: Not on file  Transportation Needs:   . Lack of Transportation (Medical): Not on file  . Lack of Transportation (Non-Medical): Not on file  Physical Activity:   . Days of Exercise per Week: Not on file  . Minutes of Exercise per Session: Not on file  Stress:   . Feeling of Stress : Not on file  Social Connections:   . Frequency of Communication with Friends and Family: Not on file  . Frequency of Social Gatherings with Friends and Family: Not on file  . Attends Religious Services: Not on file  . Active Member of Clubs or Organizations: Not on file  . Attends Banker Meetings: Not on file  . Marital Status: Not on file     Family History: The patient's family history includes Anxiety disorder in her brother and sister; Glaucoma in her father; Hypertension in her mother; Stroke in her father. There is no history of Anesthesia problems.  ROS:   All other ROS reviewed and negative. Pertinent positives noted in the HPI.     EKGs/Labs/Other Studies Reviewed:   The following studies were personally reviewed by me today:  Echo 08/03/2020 1. Left ventricular ejection fraction, by estimation, is 60 to 65%. The  left ventricle has normal function. The left ventricle has no regional  wall motion abnormalities. Left ventricular diastolic parameters were  normal.  2. Right ventricular systolic function is normal. The right ventricular  size is normal.  3. The mitral valve is normal in structure. No evidence of mitral valve  regurgitation. No evidence of mitral stenosis.  4. The aortic valve is normal in structure. Aortic valve regurgitation is  not visualized. No aortic stenosis is present.  5. The inferior vena cava is  normal in size with greater than 50%  respiratory variability, suggesting right atrial pressure of 3 mmHg.  LHC 08/03/2020  Prox RCA to Mid RCA lesion is 80% stenosed.  Ramus-1 lesion is 70% stenosed.  Ramus-2 lesion is 95% stenosed.  Ramus-3 lesion is 70% stenosed.  A drug-eluting stent was successfully placed using a STENT RESOLUTE ONYX 2.5X26.  Post intervention, there is a 0% residual stenosis.  A drug-eluting stent was successfully placed.  Post intervention, there is a 0% residual stenosis.  Post intervention, there is a 0% residual stenosis.  Post intervention, there is a 0%  residual stenosis.     Recent Labs: 08/03/2020: TSH 0.762 08/04/2020: BUN 8; Creatinine, Ser 0.75; Hemoglobin 12.4; Platelets 283; Potassium 3.4; Sodium 137   Recent Lipid Panel    Component Value Date/Time   CHOL 196 08/04/2020 0344   TRIG 149 08/04/2020 0344   HDL 38 (L) 08/04/2020 0344   CHOLHDL 5.2 08/04/2020 0344   VLDL 30 08/04/2020 0344   LDLCALC 128 (H) 08/04/2020 0344    Physical Exam:   VS:  BP 110/72   Pulse 86   Ht 5\' 5"  (1.651 m)   Wt 186 lb (84.4 kg)   BMI 30.95 kg/m    Wt Readings from Last 3 Encounters:  11/17/20 186 lb (84.4 kg)  08/13/20 180 lb (81.6 kg)  08/04/20 185 lb 14.4 oz (84.3 kg)    General: Well nourished, well developed, in no acute distress Heart: Atraumatic, normal size  Eyes: PEERLA, EOMI  Neck: Supple, no JVD Endocrine: No thryomegaly Cardiac: Normal S1, S2; RRR; no murmurs, rubs, or gallops Lungs: Clear to auscultation bilaterally, no wheezing, rhonchi or rales  Abd: Soft, nontender, no hepatomegaly  Ext: No edema, pulses 2+ Musculoskeletal: No deformities, BUE and BLE strength normal and equal Skin: Warm and dry, no rashes   Neuro: Alert and oriented to person, place, time, and situation, CNII-XII grossly intact, no focal deficits  Psych: Normal mood and affect   ASSESSMENT:   Kaylee Spencer is a 39 y.o. female who presents for the  following: 1. Coronary artery disease involving native coronary artery of native heart without angina pectoris   2. Mixed hyperlipidemia   3. Essential hypertension     PLAN:   1. Coronary artery disease involving native coronary artery of native heart without angina pectoris 2. Mixed hyperlipidemia -NSTEMI 08/03/2020 -DES to ramus and RCA -He is doing well without any angina.  Blood pressure at goal.  Needs repeat lipid profile today.  She is fasting.  Also reports of having menses.  We will check a CBC.  We will continue with DAPT through August of next year.  No major surgeries planned.  Teeth cleaning and skin procedures are okay while on these medications.  3. Essential hypertension -Blood pressure is well controlled.  110/72.  Continue losartan and continue metoprolol.  Disposition: Return in about 6 months (around 05/17/2021).  Medication Adjustments/Labs and Tests Ordered: Current medicines are reviewed at length with the patient today.  Concerns regarding medicines are outlined above.  Orders Placed This Encounter  Procedures  . Lipid panel  . CBC w/Diff/Platelet   No orders of the defined types were placed in this encounter.   Patient Instructions  Medication Instructions:  Continue same medications *If you need a refill on your cardiac medications before your next appointment, please call your pharmacy*   Lab Work: Lipid panel and cbc today   Testing/Procedures: None ordered   Follow-Up: At [facility], you and your health needs are our priority.  As part of our continuing mission to provide you with exceptional heart care, we have created designated Provider Care Teams.  These Care Teams include your primary Cardiologist (physician) and Advanced Practice Providers (APPs -  Physician Assistants and Nurse Practitioners) who all work together to provide you with the care you need, when you need it.  We recommend signing up for the patient portal called  "MyChart".  Sign up information is provided on this After Visit Summary.  MyChart is used to connect with patients for Virtual Visits (Telemedicine).  Patients  are able to view lab/test results, encounter notes, upcoming appointments, etc.  Non-urgent messages can be sent to your provider as well.   To learn more about what you can do with MyChart, go to [email or web address].    Your next appointment:  6 months    Call in Feb to schedule May appointment   The format for your next appointment: Office   Provider: Dr.O'Neal      Time Spent with Patient: I have spent a total of 35 minutes with patient reviewing hospital notes, telemetry, EKGs, labs and examining the patient as well as establishing an assessment and plan that was discussed with the patient.  > 50% of time was spent in direct patient care.  Signed, Lenna Gilford. Flora Lipps, MD [facility]  [address], [address] [place], [place] [address] [phone]  11/17/2020 12:15 PM

## 2020-11-17 ENCOUNTER — Ambulatory Visit (INDEPENDENT_AMBULATORY_CARE_PROVIDER_SITE_OTHER): Payer: BC Managed Care – PPO | Admitting: Cardiovascular Disease

## 2020-11-17 ENCOUNTER — Encounter: Payer: Self-pay | Admitting: Cardiovascular Disease

## 2020-11-17 ENCOUNTER — Other Ambulatory Visit: Payer: Self-pay

## 2020-11-17 VITALS — BP 110/72 | HR 86 | Ht 65.0 in | Wt 186.0 lb

## 2020-11-17 DIAGNOSIS — I2510 Atherosclerotic heart disease of native coronary artery without angina pectoris: Secondary | ICD-10-CM

## 2020-11-17 DIAGNOSIS — I10 Essential (primary) hypertension: Secondary | ICD-10-CM | POA: Diagnosis not present

## 2020-11-17 DIAGNOSIS — E782 Mixed hyperlipidemia: Secondary | ICD-10-CM

## 2020-11-17 LAB — CBC WITH DIFFERENTIAL/PLATELET
Basophils Absolute: 0 10*3/uL (ref 0.0–0.2)
Basos: 0 %
EOS (ABSOLUTE): 0.6 10*3/uL — ABNORMAL HIGH (ref 0.0–0.4)
Eos: 6 %
Hematocrit: 36.4 % (ref 34.0–46.6)
Hemoglobin: 12.3 g/dL (ref 11.1–15.9)
Immature Grans (Abs): 0 10*3/uL (ref 0.0–0.1)
Immature Granulocytes: 0 %
Lymphocytes Absolute: 2.9 10*3/uL (ref 0.7–3.1)
Lymphs: 28 %
MCH: 29.2 pg (ref 26.6–33.0)
MCHC: 33.8 g/dL (ref 31.5–35.7)
MCV: 87 fL (ref 79–97)
Monocytes Absolute: 0.5 10*3/uL (ref 0.1–0.9)
Monocytes: 5 %
Neutrophils Absolute: 6.3 10*3/uL (ref 1.4–7.0)
Neutrophils: 61 %
Platelets: 329 10*3/uL (ref 150–450)
RBC: 4.21 x10E6/uL (ref 3.77–5.28)
RDW: 13.8 % (ref 11.7–15.4)
WBC: 10.3 10*3/uL (ref 3.4–10.8)

## 2020-11-17 LAB — LIPID PANEL
Chol/HDL Ratio: 2.8 ratio (ref 0.0–4.4)
Cholesterol, Total: 136 mg/dL (ref 100–199)
HDL: 49 mg/dL (ref >39)
LDL Chol Calc (NIH): 68 mg/dL (ref 0–99)
Triglycerides: 101 mg/dL (ref 0–149)
VLDL Cholesterol Cal: 19 mg/dL (ref 5–40)

## 2020-11-17 NOTE — Patient Instructions (Signed)
Medication Instructions:  Continue same medications *If you need a refill on your cardiac medications before your next appointment, please call your pharmacy*   Lab Work: Lipid panel and cbc today   Testing/Procedures: None ordered   Follow-Up: At [facility], you and your health needs are our priority.  As part of our continuing mission to provide you with exceptional heart care, we have created designated Provider Care Teams.  These Care Teams include your primary Cardiologist (physician) and Advanced Practice Providers (APPs -  Physician Assistants and Nurse Practitioners) who all work together to provide you with the care you need, when you need it.  We recommend signing up for the patient portal called "MyChart".  Sign up information is provided on this After Visit Summary.  MyChart is used to connect with patients for Virtual Visits (Telemedicine).  Patients are able to view lab/test results, encounter notes, upcoming appointments, etc.  Non-urgent messages can be sent to your provider as well.   To learn more about what you can do with MyChart, go to [email or web address].    Your next appointment:  6 months    Call in Feb to schedule May appointment   The format for your next appointment: Office   Provider: Dr.O'Neal

## 2020-12-02 ENCOUNTER — Other Ambulatory Visit: Payer: Self-pay

## 2020-12-02 ENCOUNTER — Encounter: Payer: Self-pay | Admitting: Family Medicine

## 2020-12-02 ENCOUNTER — Ambulatory Visit (INDEPENDENT_AMBULATORY_CARE_PROVIDER_SITE_OTHER): Payer: BC Managed Care – PPO | Admitting: Family Medicine

## 2020-12-02 VITALS — BP 124/74 | HR 69 | Ht 65.0 in | Wt 183.0 lb

## 2020-12-02 DIAGNOSIS — E282 Polycystic ovarian syndrome: Secondary | ICD-10-CM | POA: Diagnosis not present

## 2020-12-02 DIAGNOSIS — N926 Irregular menstruation, unspecified: Secondary | ICD-10-CM | POA: Diagnosis not present

## 2020-12-02 LAB — POCT URINE PREGNANCY: Preg Test, Ur: NEGATIVE

## 2020-12-02 LAB — POCT HEMOGLOBIN: Hemoglobin: 10.8 g/dL — AB (ref 11–14.6)

## 2020-12-02 MED ORDER — MEGESTROL ACETATE 40 MG PO TABS: ORAL_TABLET | ORAL | 0 refills | Status: DC

## 2020-12-02 NOTE — Patient Instructions (Signed)
It was a pleasure to see you today!  Thank you for choosing [facility] for your primary care.    Our plans for today were:  Megace 40 mg: Take 1 tablet twice a day for 2 weeks, then take 1 tablet once a day for 2 weeks  We are scheduling a pelvic ultrasound evaluate the cause of your bleeding  Please follow-up in 1 month (after your ultrasound) to start birth control to help control your bleeding.  I believe this would be important since you are on a blood thinner.  Restart taking an iron supplement daily.  If you get constipated you can use MiraLAX.   Best Wishes,   Orpah Cobb, DO

## 2020-12-02 NOTE — Progress Notes (Signed)
Subjective:   Patient ID: Kaylee Spencer    DOB: November 23, 1981, 39 y.o. female   MRN: [id number]  Kaylee Spencer is a 39 y.o. female with a history of HTN, NSTEMI, PCOS, hydradenitis, dysfunctional uterine bleeding, anxiety/depression, HSIL, high risk sexual behavior, HLD, hypokalemia here for irregular bleeding  Irregular Period: She has a chronic history of irregular bleeding with documented history of PCOS. She notes that this is her second period this month. Her last period was 11/24. Started on 12/9. Prior to this she had regular periods monthly for the last 2-3 years. Flow is heavy with large clots, menstrual cramps, bright red blood with dark red clots. She notes the period has lightened up some.  She has had to use megace in the past to help stop her periods. She notes that she feel more tired with exertion, feels more weak.  Sexually active: 1 female partner Contraception: none Family history of early menopause: none  She was seen for similar symptoms in 2013 where she was given Provera and scheduled an ultrasound. She has on Brilinta and ASA which worries her. She uses 2-3 extra large night time pads every few hours  She had a myocardial infarction in August 16th 2021 s/p 2 stents and is now on Brilenta and ASA.   Review of Systems:  Per HPI.   Objective:   BP 124/74   Pulse 69   Ht 5\' 5"  (1.651 m)   Wt 183 lb (83 kg)   LMP 11/26/2020 (Exact Date)   SpO2 98%   BMI 30.45 kg/m  Vitals and nursing note reviewed.  General: pleasant middle aged female, sitting comfortably in exam chair, well nourished, well developed, in no acute distress with non-toxic appearance Resp: breathing comfortably on room air, speaking in full sentences MSK: gait normal Neuro: Alert and oriented, speech normal Pelvic exam: VULVA: normal appearing vulva with no masses, tenderness or lesions, VAGINA: normal appearing vagina with normal color and discharge, no lesions, CERVIX: normal appearing  cervix without lesions, cervical discharge present - bloody.  Prior Imaging: Transvaginal Pelvic Ultrasound 05/31/12 IMPRESSION:  Mildly inhomogeneous appearance to the endometrium at the uterine  fundus. Given the history of dysfunctional uterine bleeding, underlying polyp is not excluded. If the symptoms are unresponsive  to medical treatment, consider sonohysterography.    Pelvic Ultrasound: 03/24/04 IMPRESSION  1. Anatomic variant bicornuate uterus.   2. Slight right pelvic free fluid (findings).  3. Otherwise no significant abnormality.   Assessment & Plan:   Irregular periods Acute on chronic. Has not had irregular periods in several years. Presents with 2nd period this month with heavy bleeding and large clots. Upreg negative. POC Hgb down from 12.3 on 11/30 to 10/8 today with symptoms. She has a history of PCOS placing her at risk for chronic unopposed estrogen. Causes for this irregular bleeding is braod and can range from polyps, Adenomyosis, Leiomyoma, malignancy/hyperplasia, Ovulatory dysfunction, Endometrial dysfunction. At this time I will perform full work up to evaluate for cause given her risk factors. - Megace 40mg  BID x 2 weeks then QD x 2 weeks - Endometrial biopsy x 2 weeks with me. Will plan to obtain repeat POC Hgb at that time - pelvic ultrasound in 4 weeks to evaluate for intrauterine cause - Pelvic exam negative for cause - Patient encouraged to start iron supplement - she would like benefit from contraception. Given her CAD, this limits her to IUD, Nexplanon, and POPs  PCOS (polycystic ovarian syndrome) Chronic. History of recent MI s/p  2 stents now on Brilinta and ASA. Given PCOS, she is high risk for hyperlipidemia and diabetes. Last A1C in 07/2020 was 5.8. Shed likely benefit from Metformin. Will defer to PCP. Already on statin.   Orders Placed This Encounter  Procedures  . US Pelvic Complete With Transvaginal    bcbs Epic order No Needs No Covid WT:  183 No Spinal Stimulator or Body Injector no glucose monitors Drink 32 oz of water 1 hour before appointment and do not void after starting to drink until exam is over.      Standing Status:   Future    Standing Expiration Date:   12/02/2021    Order Specific Question:   Reason for Exam (SYMPTOM  OR DIAGNOSIS REQUIRED)    Answer:   heavy irregular bleeding    Order Specific Question:   Preferred imaging location?    Answer:   [facility]  . POCT urine pregnancy  . Hemoglobin   Meds ordered this encounter  Medications  . megestrol (MEGACE) 40 MG tablet    Sig: Take 1 tablet (40 mg) twice a day x 2 weeks, then 1 tablet (40mg ) once a day x 2 weeks.    Dispense:  42 tablet    Refill:  0    , DO PGY-3, [facility] 12/04/2020 9:15 AM

## 2020-12-04 DIAGNOSIS — N926 Irregular menstruation, unspecified: Secondary | ICD-10-CM | POA: Insufficient documentation

## 2020-12-04 NOTE — Assessment & Plan Note (Signed)
Chronic. History of recent MI s/p 2 stents now on Brilinta and ASA. Given PCOS, she is high risk for hyperlipidemia and diabetes. Last A1C in 07/2020 was 5.8. Shed likely benefit from Metformin. Will defer to PCP. Already on statin.

## 2020-12-04 NOTE — Assessment & Plan Note (Addendum)
Acute on chronic. Has not had irregular periods in several years. Presents with 2nd period this month with heavy bleeding and large clots. Upreg negative. POC Hgb down from 12.3 on 11/30 to 10/8 today with symptoms. She has a history of PCOS placing her at risk for chronic unopposed estrogen. Causes for this irregular bleeding is braod and can range from polyps, Adenomyosis, Leiomyoma, malignancy/hyperplasia, Ovulatory dysfunction, Endometrial dysfunction. At this time I will perform full work up to evaluate for cause given her risk factors. - Megace 40mg  BID x 2 weeks then QD x 2 weeks - Endometrial biopsy x 2 weeks with me. Will plan to obtain repeat POC Hgb at that time - pelvic ultrasound in 4 weeks to evaluate for intrauterine cause - Pelvic exam negative for cause - Patient encouraged to start iron supplement - she would like benefit from contraception. Given her CAD, this limits her to IUD, Nexplanon, and POPs

## 2020-12-21 ENCOUNTER — Ambulatory Visit: Payer: BC Managed Care – PPO | Admitting: Family Medicine

## 2021-01-05 ENCOUNTER — Ambulatory Visit
Admission: RE | Admit: 2021-01-05 | Discharge: 2021-01-05 | Disposition: A | Discharge status: Home / Self Care | Payer: BC Managed Care – PPO | Source: Ambulatory Visit | Attending: Family Medicine | Admitting: Family Medicine

## 2021-01-05 DIAGNOSIS — N926 Irregular menstruation, unspecified: Secondary | ICD-10-CM

## 2021-03-10 ENCOUNTER — Telehealth: Payer: BC Managed Care – PPO | Admitting: Physician Assistant

## 2021-03-10 DIAGNOSIS — J0100 Acute maxillary sinusitis, unspecified: Secondary | ICD-10-CM | POA: Diagnosis not present

## 2021-03-10 MED ORDER — AMOXICILLIN-POT CLAVULANATE 875-125 MG PO TABS: 1.0000 | ORAL_TABLET | Freq: Two times a day (BID) | ORAL | 0 refills | Status: DC

## 2021-03-10 NOTE — Progress Notes (Signed)

## 2021-03-10 NOTE — Progress Notes (Signed)
I have spent 5 minutes in review of e-visit questionnaire, review and updating patient chart, medical decision making and response to patient.   Jameriah Trotti Cody Wretha Laris, PA-C    

## 2021-04-08 IMAGING — US US PELVIS COMPLETE WITH TRANSVAGINAL
1 series · 13 of 25 positions shown · non-contrast
Comparison: Ultrasound 05/31/2012

CLINICAL DATA: Heavy irregular bleeding

EXAM:
TRANSABDOMINAL AND TRANSVAGINAL ULTRASOUND OF PELVIS
TECHNIQUE: Both transabdominal and transvaginal ultrasound examinations of the
pelvis were performed. Transabdominal technique was performed for
global imaging of the pelvis including uterus, ovaries, adnexal
regions, and pelvic cul-de-sac. It was necessary to proceed with
endovaginal exam following the transabdominal exam to visualize the
uterus endometrium ovaries.

[Series 1: us pelvis complete with transvaginal · 0.12mm/px · 13 of 109 slices shown]
[im 1/109]
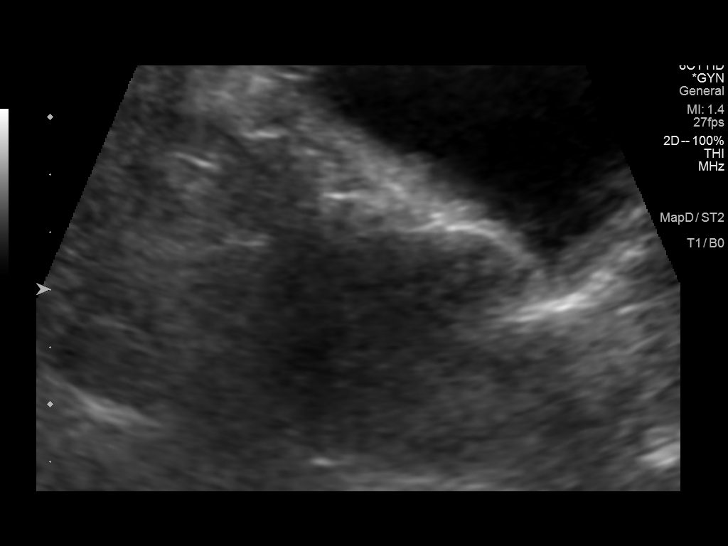
[im 10/109]
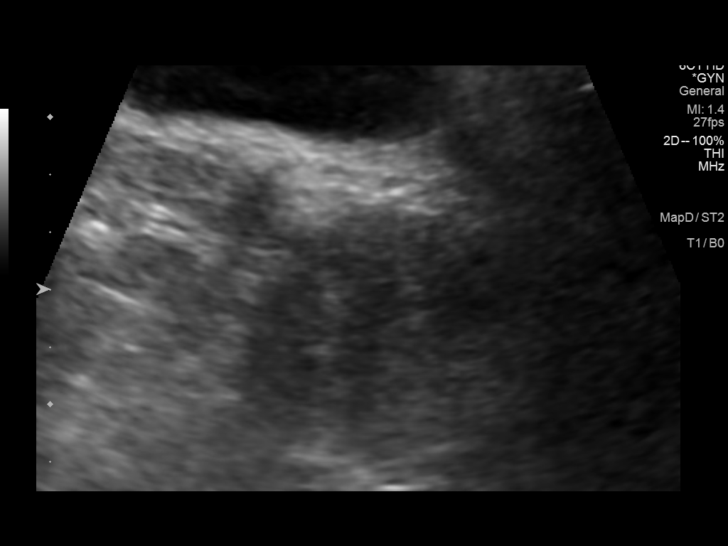
[im 19/109]
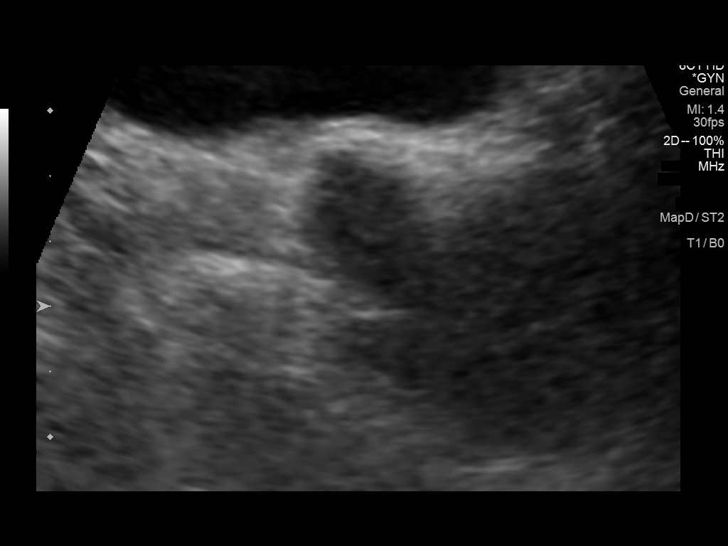
[im 28/109]
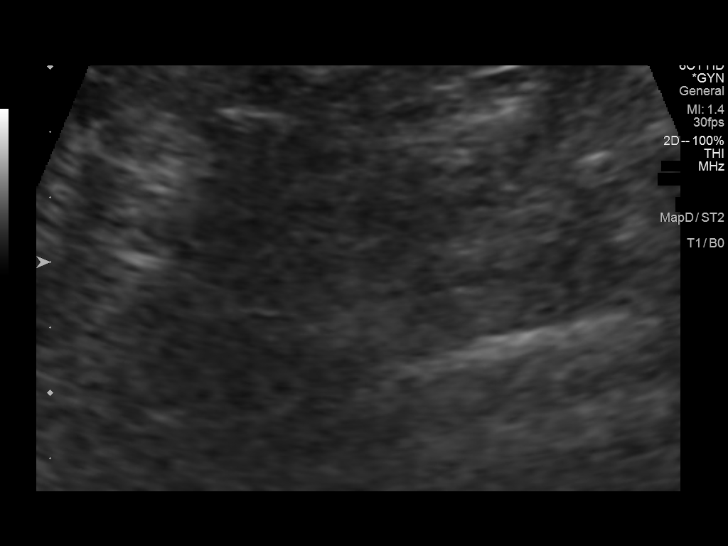
[im 37/109]
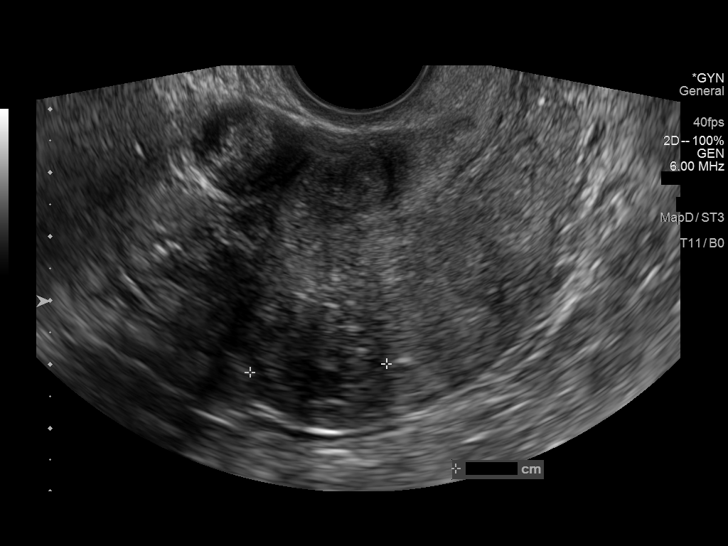
[im 46/109]
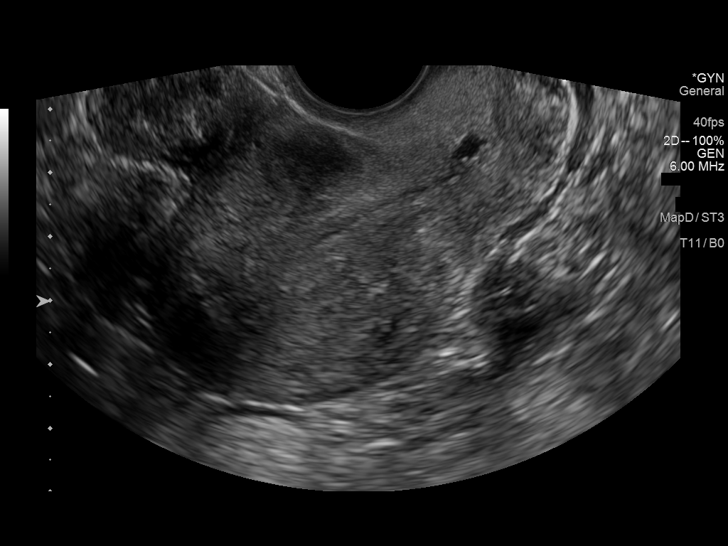
[im 55/109]
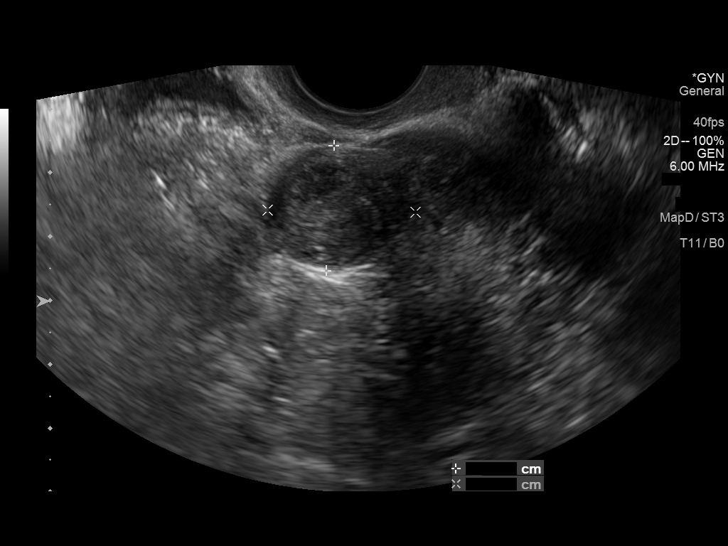
[im 64/109]
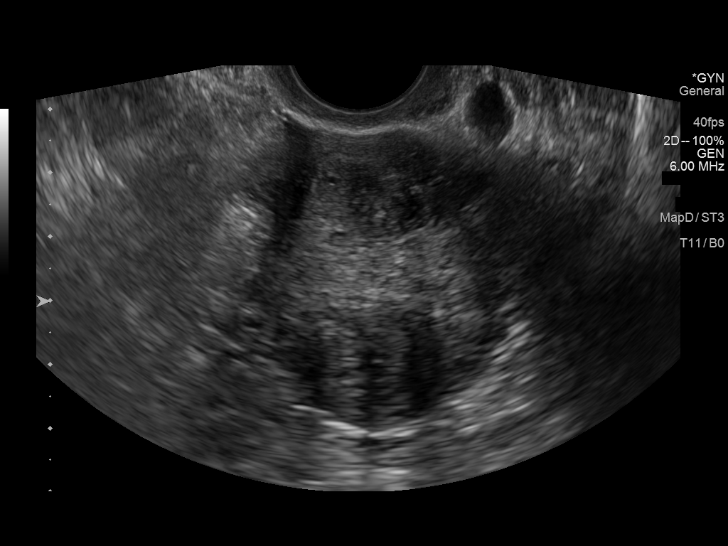
[im 73/109]
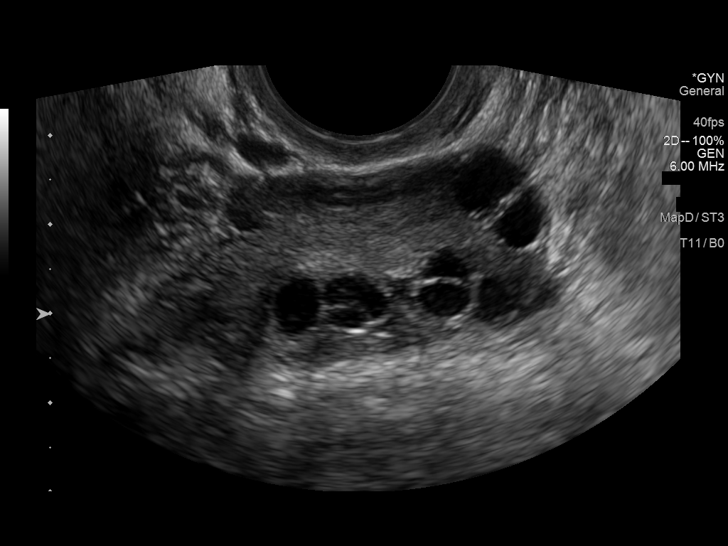
[im 82/109]
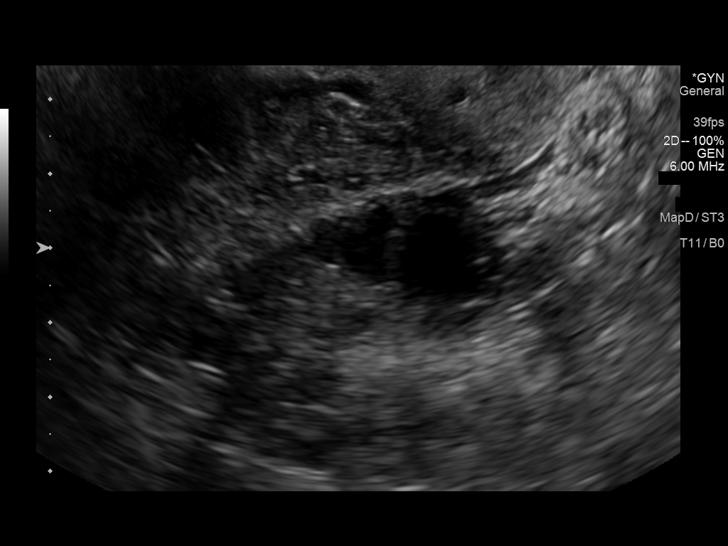
[im 91/109]
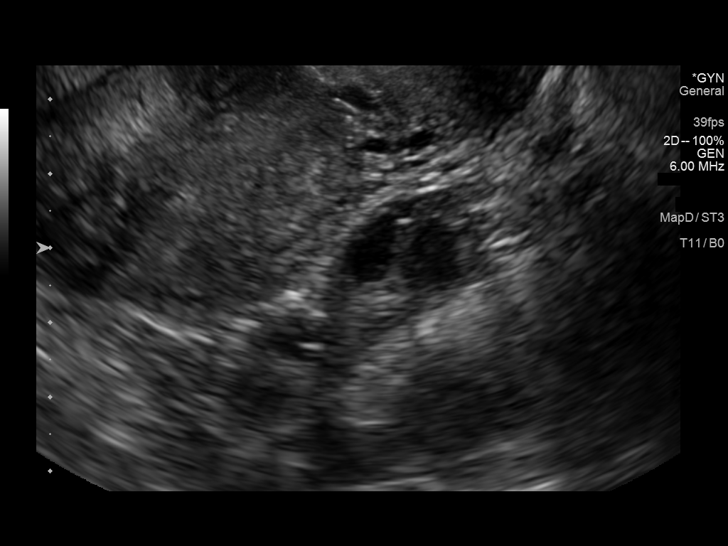
[im 100/109]
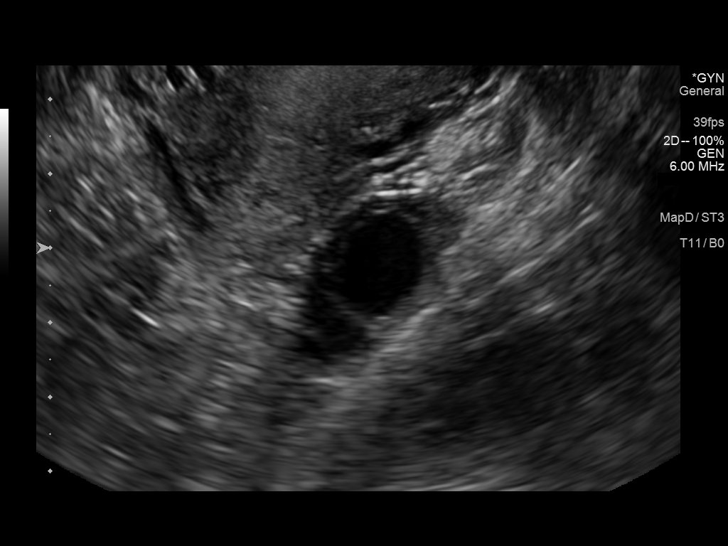
[im 109/109]
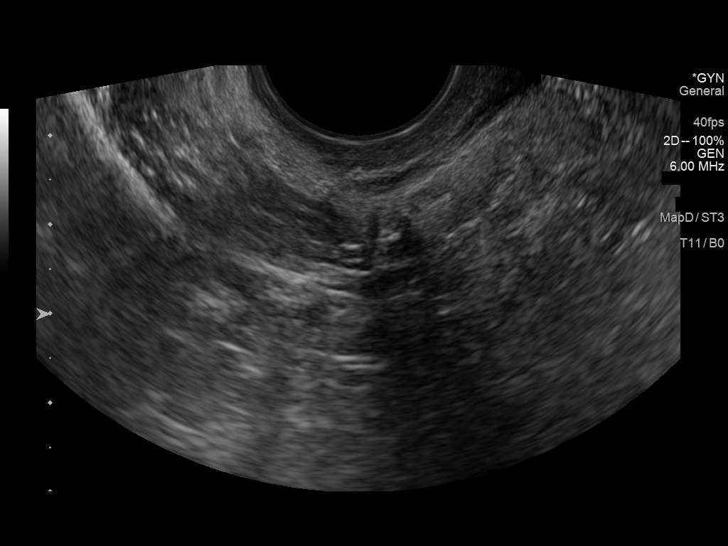

[13 of 25 positions shown; findings below may reference images not displayed]

FINDINGS: Uterus

Measurements: 7.1 x 3.9 x 4.7 cm = volume: 68.1 mL. Multiple
myometrial masses consistent with fibroids. Subserosal mid anterior
uterine mass measuring 1.3 x 1.4 by 1.8 cm. Posterior uterine fundal
mass measuring 2.1 x 1.8 x 1.9 cm. Small intramural posterior mid
uterine mass measuring 1.3 x 1.3 x 1.5 cm. Partially exophytic
fundal fibroid measuring 2.7 x 2 x 2.3 cm.

Endometrium

Thickness: 7.2 mm.  No focal abnormality visualized.

Right ovary

Measurements: 4.4 x 2.3 x 2.6 cm = volume: 13.2 mL. Normal
appearance/no adnexal mass.

Left ovary

Measurements: 3.9 x 2 x 1.7 cm = volume: 6.9 mL. Normal
appearance/no adnexal mass.

Other findings

No abnormal free fluid.
IMPRESSION: 1. Multiple uterine fibroids.
2. Normal premenopausal endometrial thickness.

## 2021-04-16 ENCOUNTER — Ambulatory Visit (INDEPENDENT_AMBULATORY_CARE_PROVIDER_SITE_OTHER): Payer: BC Managed Care – PPO | Admitting: Family Medicine

## 2021-04-16 ENCOUNTER — Other Ambulatory Visit: Payer: Self-pay

## 2021-04-16 VITALS — BP 149/86 | HR 70 | Ht 64.0 in | Wt 189.0 lb

## 2021-04-16 DIAGNOSIS — Z0000 Encounter for general adult medical examination without abnormal findings: Secondary | ICD-10-CM | POA: Diagnosis not present

## 2021-04-16 DIAGNOSIS — D649 Anemia, unspecified: Secondary | ICD-10-CM | POA: Diagnosis not present

## 2021-04-16 LAB — POCT HEMOGLOBIN: Hemoglobin: 11.5 g/dL (ref 11–14.6)

## 2021-04-16 NOTE — Patient Instructions (Signed)
It was a pleasure seeing you today.  We have completed your physical and we are going to check your hemoglobin just because it was low at the last check.  There is no need for any other lab work.  You are in need of a Pap smear because of your previous results.  Please schedule this appointment on the way out.  If you have any questions or concerns please feel free to call the clinic.  I hope you have a wonderful afternoon!

## 2021-04-16 NOTE — Progress Notes (Signed)
SUBJECTIVE:   CHIEF COMPLAINT / HPI:   Physical Patient presents for her annual work physical.  She has no concerns or complaints at this time.  She reports that she was diagnosed with anemia at a previous visit and has been taking supplemental iron daily for this.  Denies any irregular vaginal bleeding.  Reports that she had a Pap smear and was supposed to have colposcopy but missed the appointment.  She then had a repeat Pap smear which was normal.  She is unsure if she needs further testing at this time.  Regarding lab work the patient follows with cardiology after having an NSTEMI last August (/2021) and does not need any lab work at this time other than to check her hemoglobin.  OBJECTIVE:   BP (!) 149/86   Pulse 70   Ht 5\' 4"  (1.626 m)   Wt 189 lb (85.7 kg)   LMP 03/26/2021   SpO2 100%   BMI 32.44 kg/m   Physical Exam Constitutional:      General: She is not in acute distress.    Appearance: She is not ill-appearing.  HENT:     Head: Normocephalic and atraumatic.     Right Ear: Tympanic membrane, ear canal and external ear normal.     Left Ear: Tympanic membrane, ear canal and external ear normal.     Nose: Nose normal. No congestion.     Comments: Erythema nasal turbinates consistent with allergic rhinitis    Mouth/Throat:     Mouth: Mucous membranes are moist.     Pharynx: No oropharyngeal exudate or posterior oropharyngeal erythema.  Eyes:     General:        Right eye: No discharge.     Extraocular Movements: Extraocular movements intact.     Conjunctiva/sclera: Conjunctivae normal.     Pupils: Pupils are equal, round, and reactive to light.  Cardiovascular:     Rate and Rhythm: Normal rate and regular rhythm.     Pulses: Normal pulses.     Heart sounds: No murmur heard.   Pulmonary:     Effort: Pulmonary effort is normal.     Breath sounds: Normal breath sounds.  Abdominal:     General: Bowel sounds are normal. There is no distension.     Palpations:  Abdomen is soft.     Tenderness: There is no abdominal tenderness.  Musculoskeletal:        General: Normal range of motion.     Cervical back: Normal range of motion and neck supple.  Skin:    General: Skin is warm and dry.     Capillary Refill: Capillary refill takes less than 2 seconds.  Neurological:     General: No focal deficit present.     Mental Status: She is alert and oriented to person, place, and time.     Cranial Nerves: No cranial nerve deficit.  Psychiatric:        Mood and Affect: Mood normal.        Behavior: Behavior normal.        Thought Content: Thought content normal.        Judgment: Judgment normal.      ASSESSMENT/PLAN:   Annual physical exam Patient presents for annual wellness exam.  Repeat hemoglobin today improved to 11.5 with supplemental iron.  She will continue supplemental iron.  Discussed Pap history with attending.  She is in need of a repeat Pap smear with cotesting and she will schedule that appointment  when she leaves today.  She cannot have it done today.  No need for lab work today because she had lipid panel, BMP, CBC collected by cardiology.  She will come back for her Pap smear and any other things as needed. Next time we collected blood work we can collect hepatitis C screening as well.     Derrel Nip, MD [facility]

## 2021-04-18 NOTE — Assessment & Plan Note (Addendum)
Patient presents for annual wellness exam.  Repeat hemoglobin today improved to 11.5 with supplemental iron.  She will continue supplemental iron.  Discussed Pap history with attending.  She is in need of a repeat Pap smear with cotesting and she will schedule that appointment when she leaves today.  She cannot have it done today.  No need for lab work today because she had lipid panel, BMP, CBC collected by cardiology.  She will come back for her Pap smear and any other things as needed. Next time we collected blood work we can collect hepatitis C screening as well.

## 2021-05-17 NOTE — Progress Notes (Signed)
Cardiology Office Note:   Date:  05/19/2021  NAME:  Kaylee Spencer    MRN: [id number] DOB:  [birth year]   PCP:  Derrel Nip, MD  Cardiologist:  Reatha Harps, MD   Referring MD: Derrel Nip, MD   Chief Complaint  Patient presents with  . Follow-up   History of Present Illness:   Kaylee Spencer is a 40 y.o. female with a hx of premature CAD, HTN, HLD who presents for follow-up.  She is doing well.  She reports that for the past 3 to 4 weeks she has had symptoms of heartburn.  She describes dull burning sensation in the center of her chest.  It can last hours.  They occur 2 times per month.  She describes 4 out of 10 discomfort.  Symptoms are alleviated by drinking water or burping.  She also has noticed improvement with Tums.  I suspect this is just heartburn related.  Her EKG shows sinus rhythm with no acute ischemic changes or evidence of infarction.  She reports that she is exercising 3 times per week.  She is walking up to 4 miles.  She has no chest pain or shortness of breath with this level activity.  I have informed her that she can come off Brilinta 1 year from her PCI appointment.  This will be August 03, 2021.  She is having no bleeding issues.  She will remain on aspirin indefinitely.  She is at goal for her cholesterol numbers.  Overall doing well.  Weights are stable.   Problem List 1. CAD -NSTEMI 08/03/2020 -DES to ramus and RCA 2. HTN 3. HLD -T chol 136, HDL 49, LDL 68, TG 101  Past Medical History: Past Medical History:  Diagnosis Date  . Anxiety   . Coronary artery disease   . Headache(784.0)   . 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 Hypertension   . Migraine headache with aura     Past Surgical History: Past Surgical History:  Procedure Laterality Date  . boil removal    . CORONARY STENT INTERVENTION N/A 08/03/2020   Procedure: CORONARY STENT INTERVENTION;  Surgeon: Runell Gess, MD;  Location: [facility] INVASIVE  CV LAB;  Service: Cardiovascular;  Laterality: N/A;  . DILATION AND CURETTAGE OF UTERUS    . LEFT HEART CATH AND CORONARY ANGIOGRAPHY N/A 08/03/2020   Procedure: LEFT HEART CATH AND CORONARY ANGIOGRAPHY;  Surgeon: Runell Gess, MD;  Location: [facility] INVASIVE CV LAB;  Service: Cardiovascular;  Laterality: N/A;  . tooth extraction Bilateral     Current Medications: Current Meds  Medication Sig  . aspirin 81 MG chewable tablet Chew 1 tablet (81 mg total) by mouth daily.  Marland Kitchen atorvastatin (LIPITOR) 80 MG tablet Take 1 tablet (80 mg total) by mouth daily.  Marland Kitchen losartan (COZAAR) 25 MG tablet Take 1 tablet (25 mg total) by mouth daily.  . metoprolol tartrate (LOPRESSOR) 25 MG tablet Take 0.5 tablets (12.5 mg total) by mouth 2 (two) times daily.  . ticagrelor (BRILINTA) 90 MG TABS tablet Take 1 tablet (90 mg total) by mouth 2 (two) times daily. (Patient taking differently: Take 90 mg by mouth 2 (two) times daily.)     Allergies:    Patient has no known allergies.   Social History: Social History   Socioeconomic History  . Marital status: Single    Spouse name: Not on file  . Number of children: Not on file  . Years of education: Not on file  . Highest  education level: Not on file  Occupational History  . Occupation: Teacher, English as a foreign language  Tobacco Use  . Smoking status: Former Smoker    Quit date: 04/22/1998    Years since quitting: 23.0  . Smokeless tobacco: Never Used  Vaping Use  . Vaping Use: Never used  Substance and Sexual Activity  . Alcohol use: Yes    Comment: occassional  . Drug use: No    Comment: as teenager  . Sexual activity: Yes    Birth control/protection: None  Other Topics Concern  . Not on file  Social History Narrative  . Not on file   Social Determinants of Health   Financial Resource Strain: Not on file  Food Insecurity: Not on file  Transportation Needs: Not on file  Physical Activity: Not on file  Stress: Not on file  Social Connections: Not on file      Family History: The patient's family history includes Anxiety disorder in her brother and sister; Glaucoma in her father; Hypertension in her mother; Stroke in her father. There is no history of Anesthesia problems.  ROS:   All other ROS reviewed and negative. Pertinent positives noted in the HPI.     EKGs/Labs/Other Studies Reviewed:   The following studies were personally reviewed by me today:  EKG:  EKG is  ordered today.  The ekg ordered today demonstrates normal sinus rhythm heart rate 66, no acute ischemic changes or evidence of infarction, and was personally reviewed by me.   LHC 08/03/2020   Prox RCA to Mid RCA lesion is 80% stenosed.  Ramus-1 lesion is 70% stenosed.  Ramus-2 lesion is 95% stenosed.  Ramus-3 lesion is 70% stenosed.  A drug-eluting stent was successfully placed using a STENT RESOLUTE ONYX 2.5X26.  Post intervention, there is a 0% residual stenosis.  A drug-eluting stent was successfully placed.  Post intervention, there is a 0% residual stenosis.  Post intervention, there is a 0% residual stenosis.  Post intervention, there is a 0% residual stenosis.  TTE 08/03/2020 1. Left ventricular ejection fraction, by estimation, is 60 to 65%. The  left ventricle has normal function. The left ventricle has no regional  wall motion abnormalities. Left ventricular diastolic parameters were  normal.  2. Right ventricular systolic function is normal. The right ventricular  size is normal.  3. The mitral valve is normal in structure. No evidence of mitral valve  regurgitation. No evidence of mitral stenosis.  4. The aortic valve is normal in structure. Aortic valve regurgitation is  not visualized. No aortic stenosis is present.  5. The inferior vena cava is normal in size with greater than 50%  respiratory variability, suggesting right atrial pressure of 3 mmHg.   Recent Labs: 08/03/2020: TSH 0.762 08/04/2020: BUN 8; Creatinine, Ser 0.75; Potassium  3.4; Sodium 137 11/17/2020: Platelets 329 04/16/2021: Hemoglobin 11.5   Recent Lipid Panel    Component Value Date/Time   CHOL 136 11/17/2020 1111   TRIG 101 11/17/2020 1111   HDL 49 11/17/2020 1111   CHOLHDL 2.8 11/17/2020 1111   CHOLHDL 5.2 08/04/2020 0344   VLDL 30 08/04/2020 0344   LDLCALC 68 11/17/2020 1111    Physical Exam:   VS:  BP 118/82 (BP Location: Left Arm, Patient Position: Sitting, Cuff Size: Normal)   Pulse 73   Ht 5\' 4"  (1.626 m)   Wt 192 lb 12.8 oz (87.5 kg)   SpO2 100%   BMI 33.09 kg/m    Wt Readings from Last 3 Encounters:  05/19/21  192 lb 12.8 oz (87.5 kg)  04/16/21 189 lb (85.7 kg)  12/02/20 183 lb (83 kg)    General: Well nourished, well developed, in no acute distress Head: Atraumatic, normal size  Eyes: PEERLA, EOMI  Neck: Supple, no JVD Endocrine: No thryomegaly Cardiac: Normal S1, S2; RRR; no murmurs, rubs, or gallops Lungs: Clear to auscultation bilaterally, no wheezing, rhonchi or rales  Abd: Soft, nontender, no hepatomegaly  Ext: No edema, pulses 2+ Musculoskeletal: No deformities, BUE and BLE strength normal and equal Skin: Warm and dry, no rashes   Neuro: Alert and oriented to person, place, time, and situation, CNII-XII grossly intact, no focal deficits  Psych: Normal mood and affect   ASSESSMENT:   Maia Handa is a 40 y.o. female who presents for the following: 1. Coronary artery disease involving native coronary artery of native heart without angina pectoris   2. Mixed hyperlipidemia   3. Essential hypertension   4. Gastroesophageal reflux disease without esophagitis     PLAN:   1. Coronary artery disease involving native coronary artery of native heart without angina pectoris 2. Mixed hyperlipidemia -Non-STEMI 08/03/2020.  Drug-eluting stents to the ramus and RCA.  No symptoms of angina.  She has symptoms of heartburn. -Continue aspirin and Brilinta.  She will stop Brilinta on 08/03/2021.  She will remain on aspirin  indefinitely. -Most recent LDL 68.  Continue Lipitor 80 mg daily.  She will have repeat labs with her PCP later this year.  She will forward [place] those results. -She is exercising well.  No symptoms of angina.  She is dieting well.  3. Essential hypertension -BP well controlled on losartan and metoprolol.  No change in medications.  4. Gastroesophageal reflux disease without esophagitis -She describes symptoms of heartburn that are classic.  Her EKG is normal.  They occur 2 times per month.  She will continue with Tums as needed.  If symptoms continue she can consider a PPI.  I would recommend this if they become more frequent on a weekly basis.  Disposition: Return in about 6 months (around 11/18/2021).  Medication Adjustments/Labs and Tests Ordered: Current medicines are reviewed at length with the patient today.  Concerns regarding medicines are outlined above.  Orders Placed This Encounter  Procedures  . EKG 12-Lead   No orders of the defined types were placed in this encounter.   Patient Instructions  Medication Instructions:  Stop Brilinta on August 03, 2021. Continue with Aspirin 81 mg daily   *If you need a refill on your cardiac medications before your next appointment, please call your pharmacy*  Follow-Up: At [facility], you and your health needs are our priority.  As part of our continuing mission to provide you with exceptional heart care, we have created designated Provider Care Teams.  These Care Teams include your primary Cardiologist (physician) and Advanced Practice Providers (APPs -  Physician Assistants and Nurse Practitioners) who all work together to provide you with the care you need, when you need it.  We recommend signing up for the patient portal called "MyChart".  Sign up information is provided on this After Visit Summary.  MyChart is used to connect with patients for Virtual Visits (Telemedicine).  Patients are able to view lab/test results, encounter  notes, upcoming appointments, etc.  Non-urgent messages can be sent to your provider as well.   To learn more about what you can do with MyChart, go to [email or web address].    Your next appointment:   6 month(s)  The  format for your next appointment:   In Person  Provider:   You may see Reatha HarpsWesley T O'Neal, MD or one of the following Advanced Practice Providers on your designated Care Team:    Azalee CourseHao Meng, PA-C  Marjie Skiffallie Goodrich, [place]-C       Time Spent with Patient: I have spent a total of 25 minutes with patient reviewing hospital notes, telemetry, EKGs, labs and examining the patient as well as establishing an assessment and plan that was discussed with the patient.  > 50% of time was spent in direct patient care.  Signed, Lenna GilfordWesley T. Flora Lipps'Neal, MD, [facility] [place]  [facility]  [address] [place], [place] [address] [phone]  05/19/2021 11:33 AM

## 2021-05-19 ENCOUNTER — Ambulatory Visit (INDEPENDENT_AMBULATORY_CARE_PROVIDER_SITE_OTHER): Payer: BC Managed Care – PPO | Admitting: Cardiovascular Disease

## 2021-05-19 ENCOUNTER — Other Ambulatory Visit: Payer: Self-pay

## 2021-05-19 ENCOUNTER — Encounter: Payer: Self-pay | Admitting: Cardiovascular Disease

## 2021-05-19 VITALS — BP 118/82 | HR 73 | Ht 64.0 in | Wt 192.8 lb

## 2021-05-19 DIAGNOSIS — K219 Gastro-esophageal reflux disease without esophagitis: Secondary | ICD-10-CM

## 2021-05-19 DIAGNOSIS — E782 Mixed hyperlipidemia: Secondary | ICD-10-CM | POA: Diagnosis not present

## 2021-05-19 DIAGNOSIS — I10 Essential (primary) hypertension: Secondary | ICD-10-CM | POA: Diagnosis not present

## 2021-05-19 DIAGNOSIS — I2510 Atherosclerotic heart disease of native coronary artery without angina pectoris: Secondary | ICD-10-CM | POA: Diagnosis not present

## 2021-05-19 NOTE — Patient Instructions (Signed)
Medication Instructions:  Stop Brilinta on August 03, 2021. Continue with Aspirin 81 mg daily   *If you need a refill on your cardiac medications before your next appointment, please call your pharmacy*  Follow-Up: At [facility], you and your health needs are our priority.  As part of our continuing mission to provide you with exceptional heart care, we have created designated Provider Care Teams.  These Care Teams include your primary Cardiologist (physician) and Advanced Practice Providers (APPs -  Physician Assistants and Nurse Practitioners) who all work together to provide you with the care you need, when you need it.  We recommend signing up for the patient portal called "MyChart".  Sign up information is provided on this After Visit Summary.  MyChart is used to connect with patients for Virtual Visits (Telemedicine).  Patients are able to view lab/test results, encounter notes, upcoming appointments, etc.  Non-urgent messages can be sent to your provider as well.   To learn more about what you can do with MyChart, go to [email or web address].    Your next appointment:   6 month(s)  The format for your next appointment:   In Person  Provider:   You may see Reatha Harps, MD or one of the following Advanced Practice Providers on your designated Care Team:    Azalee Course, PA-C  Marjie Skiff, [place]

## 2021-06-02 ENCOUNTER — Other Ambulatory Visit: Payer: Self-pay

## 2021-06-02 ENCOUNTER — Other Ambulatory Visit (HOSPITAL_COMMUNITY)
Admission: RE | Admit: 2021-06-02 | Discharge: 2021-06-02 | Disposition: A | Discharge status: Home / Self Care | Payer: BC Managed Care – PPO | Source: Ambulatory Visit | Attending: Family Medicine | Admitting: Family Medicine

## 2021-06-02 ENCOUNTER — Ambulatory Visit (INDEPENDENT_AMBULATORY_CARE_PROVIDER_SITE_OTHER): Payer: BC Managed Care – PPO | Admitting: Family Medicine

## 2021-06-02 VITALS — BP 135/80 | HR 79 | Ht 64.0 in | Wt 189.8 lb

## 2021-06-02 DIAGNOSIS — R87613 High grade squamous intraepithelial lesion on cytologic smear of cervix (HGSIL): Secondary | ICD-10-CM

## 2021-06-02 NOTE — Patient Instructions (Signed)
It was great seeing you today.  We completed your Pap smear and I will either call you with the results or send them to your MyChart.  If you have any issues, questions, concerns please call the clinic.  I hope you have a wonderful day!

## 2021-06-02 NOTE — Progress Notes (Signed)
    SUBJECTIVE:   CHIEF COMPLAINT / HPI:   Pap smear Patient has history of abnormal Pap smear and presents today for repeat Pap smear.  In the past she was supposed to have a colposcopy but instead had a repeat Pap smear which came back normal.  We are checking again today to determine the need for colposcopy.  We will COVID test with HPV and genotype it.  Patient reports normal menstrual cycles with most recent being approximately 1 week ago.  No abnormal bleeding otherwise.  PERTINENT  PMH / PSH: Hx of HGSIL   OBJECTIVE:   BP 135/80   Pulse 79   Ht 5\' 4"  (1.626 m)   Wt 189 lb 12.8 oz (86.1 kg)   LMP 05/26/2021   SpO2 100%   BMI 32.58 kg/m   General: Well-appearing 40 year old female, no acute distress Respiratory: Normal work of breathing, speaking in full sentence without difficulty GU: Normal external vaginal tissue, normal internal vaginal tissue with no bleeding appreciated, cervix has multiple small discolorations but no friability or irritation.  Pap smear completed today  ASSESSMENT/PLAN:   High grade squamous intraepithelial cervical dysplasia Repeat Pap smear completed today.  Pending the results patient may need colposcopy.  I will call the patient with results when they come back and schedule her appointment if needed.     46, MD [facility]

## 2021-06-02 NOTE — Assessment & Plan Note (Signed)
Repeat Pap smear completed today.  Pending the results patient may need colposcopy.  I will call the patient with results when they come back and schedule her appointment if needed.

## 2021-06-04 LAB — CYTOLOGY - PAP
Comment: NEGATIVE
Diagnosis: NEGATIVE
High risk HPV: NEGATIVE

## 2021-07-01 ENCOUNTER — Other Ambulatory Visit: Payer: Self-pay | Admitting: Cardiovascular Disease

## 2021-07-01 NOTE — Telephone Encounter (Signed)
Not a refill that is completed by [facility] wil send to [facility] refill team

## 2021-12-13 NOTE — Progress Notes (Signed)
° ° °  SUBJECTIVE:   CHIEF COMPLAINT / HPI:   Hypertension Patient reports that her blood pressures are usually well controlled on her medications.  She has been out of her medications for several weeks now and is not quite sure of why the refill was not sent to the pharmacy.  Denies any symptoms of the hypertension but does want to stay on top of it.  HLD Patient currently on atorvastatin 80 mg daily.  Has also been out of this medication for the last few weeks.  Would like a lipid panel collected today and refill sent to her pharmacy.  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  She reports that sometimes after she dances she has hip and thigh pain and it feels like something is going to "crack" in the middle of her thigh.  She reports that the pain is not present at this time and it usually resolves with time. OBJECTIVE:   BP 140/90    Pulse 80    Ht 5\' 4"  (1.626 m)    Wt 190 lb 6.4 oz (86.4 kg)    LMP 11/14/2021 (Approximate)    SpO2 99%    BMI 32.68 kg/m   General: Well-appearing 40 year old female in no acute distress Cardiac: Regular rate and rhythm, no murmurs appreciated Respiratory: Breathing, lungs clear to auscultation bilaterally Abdomen: Soft, nontender MSK: Full range of motion of hips bilaterally, mild pain with internal rotation of the left hip.  She notices the pain in the hip as well as in her thigh and feels like a muscle is really tight.  Able to ambulate without difficulty.  ASSESSMENT/PLAN:   HTN (hypertension) Slightly elevated blood pressures today.  Has not taken her blood pressure medications recently.  Denies any signs or symptoms of hypertension.  Refill sent for patient's medications.  BMP collected today.  We will recheck at next visit when patient is on her blood pressure medications.  HLD (hyperlipidemia) Patient currently on atorvastatin 80 mg daily.  Last lipid panel collected by cardiology showing controlled cholesterol on this medication.   Lipid panel collected today.  Refill sent for atorvastatin 80 mg daily.  Hip pain Patient reports hip pain after she dances.  Resolves with time.  Physical exam was reassuring with only mild pain with internal rotation of the left hip.  Discussed stretching and other supportive measures.  Follow-up as needed.     46, MD [facility]

## 2021-12-14 ENCOUNTER — Other Ambulatory Visit: Payer: Self-pay

## 2021-12-14 ENCOUNTER — Ambulatory Visit (INDEPENDENT_AMBULATORY_CARE_PROVIDER_SITE_OTHER): Payer: BC Managed Care – PPO | Admitting: Family Medicine

## 2021-12-14 ENCOUNTER — Encounter: Payer: Self-pay | Admitting: Family Medicine

## 2021-12-14 VITALS — BP 140/90 | HR 80 | Ht 64.0 in | Wt 190.4 lb

## 2021-12-14 DIAGNOSIS — E782 Mixed hyperlipidemia: Secondary | ICD-10-CM | POA: Diagnosis not present

## 2021-12-14 DIAGNOSIS — Z1159 Encounter for screening for other viral diseases: Secondary | ICD-10-CM

## 2021-12-14 DIAGNOSIS — I10 Essential (primary) hypertension: Secondary | ICD-10-CM

## 2021-12-14 DIAGNOSIS — M25559 Pain in unspecified hip: Secondary | ICD-10-CM | POA: Insufficient documentation

## 2021-12-14 MED ORDER — METOPROLOL TARTRATE 25 MG PO TABS: 12.5000 mg | ORAL_TABLET | Freq: Two times a day (BID) | ORAL | 3 refills | Status: AC

## 2021-12-14 MED ORDER — ATORVASTATIN CALCIUM 80 MG PO TABS: 80.0000 mg | ORAL_TABLET | Freq: Every day | ORAL | 3 refills | Status: AC

## 2021-12-14 MED ORDER — LOSARTAN POTASSIUM 25 MG PO TABS: 25.0000 mg | ORAL_TABLET | Freq: Every day | ORAL | 3 refills | Status: AC

## 2021-12-14 NOTE — Assessment & Plan Note (Signed)
Patient currently on atorvastatin 80 mg daily.  Last lipid panel collected by cardiology showing controlled cholesterol on this medication.  Lipid panel collected today.  Refill sent for atorvastatin 80 mg daily.

## 2021-12-14 NOTE — Assessment & Plan Note (Signed)
Patient reports hip pain after she dances.  Resolves with time.  Physical exam was reassuring with only mild pain with internal rotation of the left hip.  Discussed stretching and other supportive measures.  Follow-up as needed.

## 2021-12-14 NOTE — Assessment & Plan Note (Signed)
Slightly elevated blood pressures today.  Has not taken her blood pressure medications recently.  Denies any signs or symptoms of hypertension.  Refill sent for patient's medications.  BMP collected today.  We will recheck at next visit when patient is on her blood pressure medications.

## 2021-12-14 NOTE — Patient Instructions (Signed)
It was wonderful seeing you today.  I am glad you are doing well.  Regarding your hip pain I recommend being sure to stretch before you exercise and be sure to hold the stretches for at least 20 seconds.  Be sure to use some of the stretches that we went over in the visit.  Regarding your blood pressure since you have not been on your medications we are not going to make any changes at this time.  I am going to check some blood work and check your cholesterol.  I will call you if there are any abnormalities but if everything is fine I will send you a MyChart message.  I hope you have a wonderful holidays!

## 2021-12-15 LAB — LIPID PANEL
Chol/HDL Ratio: 4.2 ratio (ref 0.0–4.4)
Cholesterol, Total: 227 mg/dL — ABNORMAL HIGH (ref 100–199)
HDL: 54 mg/dL (ref >39)
LDL Chol Calc (NIH): 151 mg/dL — ABNORMAL HIGH (ref 0–99)
Triglycerides: 123 mg/dL (ref 0–149)
VLDL Cholesterol Cal: 22 mg/dL (ref 5–40)

## 2021-12-15 LAB — BASIC METABOLIC PANEL
BUN/Creatinine Ratio: 17 (ref 9–23)
BUN: 15 mg/dL (ref 6–24)
CO2: 21 mmol/L (ref 20–29)
Calcium: 9 mg/dL (ref 8.7–10.2)
Chloride: 105 mmol/L (ref 96–106)
Creatinine, Ser: 0.9 mg/dL (ref 0.57–1.00)
Glucose: 110 mg/dL — ABNORMAL HIGH (ref 70–99)
Potassium: 4.1 mmol/L (ref 3.5–5.2)
Sodium: 141 mmol/L (ref 134–144)
eGFR: 82 mL/min/{1.73_m2} (ref >59)

## 2021-12-15 LAB — HEPATITIS C ANTIBODY: Hep C Virus Ab: 0.1 s/co ratio (ref 0.0–0.9)

## 2022-03-07 NOTE — Progress Notes (Signed)
?Cardiology Office Note:   ?Date:  03/10/2022  ?NAME:  Kaylee Spencer    ?MRN: [id number] ?DOB:  1981-12-02  ? ?PCP:  Derrel Nip, MD  ?Cardiologist:  Reatha Harps, MD  ?Electrophysiologist:  None  ? ?Referring MD: Derrel Nip, MD  ? ?Chief Complaint  ?Patient presents with  ? Follow-up  ? ?History of Present Illness:   ?Kaylee Spencer is a 41 y.o. female with a hx of CAD, HTN, HLD who presents for follow-up.  She reports over the past several months she has noticed chest tightness.  She describes sharp pain in her chest that occurs with menses.  Apparently when she is on her menstrual cycle she will have 5-6 episodes of chest pain per day.  This comes and goes.  No trigger.  No alleviating factor.  This does not feel like her prior cardiac pain.  She is worried given her history of CAD at an early age.  Her blood pressure has been well controlled.  134/86.  She did stop taking her Lipitor but is now back on this.  Most recent LDL 151.  She is due for repeat labs by her primary care physician.  Her EKG is normal.  This shows no evidence of infarction or ischemia.  She seems to be otherwise without issues other than this pain she has during menstruation.  We did discuss the possibility of small vessel disease.  Cardiac PET would be a good option to rule this out.  I believe this would make her feel reassured nothing is going on.  Cardiovascular lamination is normal.  Denies any other updates to her medical history. ? ?Problem List ?1. CAD ?-NSTEMI 08/03/2020 ?-DES to ramus and RCA ?2. HTN ?3. HLD ?-T chol 136, HDL 49, LDL 68, TG 101 ? ?Past Medical History: ?Past Medical History:  ?Diagnosis Date  ? Anxiety   ? Coronary artery disease   ? Headache(784.0)   ? 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 Hypertension   ? Migraine headache with aura   ? ? ?Past Surgical History: ?Past Surgical History:  ?Procedure Laterality Date  ? boil removal    ? CORONARY STENT INTERVENTION  N/A 08/03/2020  ? Procedure: CORONARY STENT INTERVENTION;  Surgeon: Runell Gess, MD;  Location: [facility] INVASIVE CV LAB;  Service: Cardiovascular;  Laterality: N/A;  ? DILATION AND CURETTAGE OF UTERUS    ? LEFT HEART CATH AND CORONARY ANGIOGRAPHY N/A 08/03/2020  ? Procedure: LEFT HEART CATH AND CORONARY ANGIOGRAPHY;  Surgeon: Runell Gess, MD;  Location: [facility] INVASIVE CV LAB;  Service: Cardiovascular;  Laterality: N/A;  ? tooth extraction Bilateral   ? ? ?Current Medications: ?Current Meds  ?Medication Sig  ? aspirin 81 MG chewable tablet Chew 1 tablet (81 mg total) by mouth daily.  ? atorvastatin (LIPITOR) 80 MG tablet Take 1 tablet (80 mg total) by mouth daily.  ? metoprolol tartrate (LOPRESSOR) 25 MG tablet Take 0.5 tablets (12.5 mg total) by mouth 2 (two) times daily.  ?  ? ?Allergies:    ?Patient has no known allergies.  ? ?Social History: ?Social History  ? ?Socioeconomic History  ? Marital status: Single  ?  Spouse name: Not on file  ? Number of children: Not on file  ? Years of education: Not on file  ? Highest education level: Not on file  ?Occupational History  ? Occupation: Teacher, English as a foreign language  ?Tobacco Use  ? Smoking status: Former  ?  Types: Cigarettes  ?  Quit  date: 04/22/1998  ?  Years since quitting: 23.8  ? Smokeless tobacco: Never  ?Vaping Use  ? Vaping Use: Never used  ?Substance and Sexual Activity  ? Alcohol use: Yes  ?  Comment: occassional  ? Drug use: No  ?  Comment: as teenager  ? Sexual activity: Yes  ?  Birth control/protection: None  ?Other Topics Concern  ? Not on file  ?Social History Narrative  ? Not on file  ? ?Social Determinants of Health  ? ?Financial Resource Strain: Not on file  ?Food Insecurity: Not on file  ?Transportation Needs: Not on file  ?Physical Activity: Not on file  ?Stress: Not on file  ?Social Connections: Not on file  ?  ? ?Family History: ?The patient's family history includes Anxiety disorder in her brother and sister; Glaucoma in her father; Hypertension in her  mother; Stroke in her father. There is no history of Anesthesia problems. ? ?ROS:   ?All other ROS reviewed and negative. Pertinent positives noted in the HPI.    ? ?EKGs/Labs/Other Studies Reviewed:   ?The following studies were personally reviewed by me today: ? ?EKG:  EKG is ordered today.  The ekg ordered today demonstrates normal sinus rhythm heart rate 69, no acute ischemic changes or evidence of infarction, and was personally reviewed by me.  ? ?TTE 08/03/2020 ? 1. Left ventricular ejection fraction, by estimation, is 60 to 65%. The  ?left ventricle has normal function. The left ventricle has no regional  ?wall motion abnormalities. Left ventricular diastolic parameters were  ?normal.  ? 2. Right ventricular systolic function is normal. The right ventricular  ?size is normal.  ? 3. The mitral valve is normal in structure. No evidence of mitral valve  ?regurgitation. No evidence of mitral stenosis.  ? 4. The aortic valve is normal in structure. Aortic valve regurgitation is  ?not visualized. No aortic stenosis is present.  ? 5. The inferior vena cava is normal in size with greater than 50%  ?respiratory variability, suggesting right atrial pressure of 3 mmHg.  ? ?Recent Labs: ?04/16/2021: Hemoglobin 11.5 ?12/14/2021: BUN 15; Creatinine, Ser 0.90; Potassium 4.1; Sodium 141  ? ?Recent Lipid Panel ?   ?Component Value Date/Time  ? CHOL 227 (H) 12/14/2021 0855  ? TRIG 123 12/14/2021 0855  ? HDL 54 12/14/2021 0855  ? CHOLHDL 4.2 12/14/2021 0855  ? CHOLHDL 5.2 08/04/2020 0344  ? VLDL 30 08/04/2020 0344  ? LDLCALC 151 (H) 12/14/2021 0855  ? ? ?Physical Exam:   ?VS:  BP 134/86   Pulse 69   Ht 5\' 4"  (1.626 m)   Wt 182 lb (82.6 kg)   SpO2 98%   BMI 31.24 kg/m?    ?Wt Readings from Last 3 Encounters:  ?03/10/22 182 lb (82.6 kg)  ?12/14/21 190 lb 6.4 oz (86.4 kg)  ?06/02/21 189 lb 12.8 oz (86.1 kg)  ?  ?General: Well nourished, well developed, in no acute distress ?Head: Atraumatic, normal size  ?Eyes: PEERLA, EOMI   ?Neck: Supple, no JVD ?Endocrine: No thryomegaly ?Cardiac: Normal S1, S2; RRR; no murmurs, rubs, or gallops ?Lungs: Clear to auscultation bilaterally, no wheezing, rhonchi or rales  ?Abd: Soft, nontender, no hepatomegaly  ?Ext: No edema, pulses 2+ ?Musculoskeletal: No deformities, BUE and BLE strength normal and equal ?Skin: Warm and dry, no rashes   ?Neuro: Alert and oriented to person, place, time, and situation, CNII-XII grossly intact, no focal deficits  ?Psych: Normal mood and affect  ? ?ASSESSMENT:   ?Rosalinda Cliburn  is a 41 y.o. female who presents for the following: ?1. Precordial pain   ?2. Coronary artery disease involving native coronary artery of native heart without angina pectoris   ?3. Mixed hyperlipidemia   ?4. Primary hypertension   ? ? ?PLAN:   ?1. Precordial pain ?-Describes episodic chest discomfort when she is on her menstrual cycle.  Does not have exertional chest pain or pressure.  Seems to be occurring at rest.  She does have a history of early CAD.  I do wonder if this represents vasospastic angina.  Also could represent microvascular dysfunction.  We discussed cardiac PET to exclude this.  She is willing to do this test.  I believe this would be a good option given her young age and history of premature CAD.  We will set her up in the next few weeks. ? ?2. Coronary artery disease involving native coronary artery of native heart without angina pectoris ?3. Mixed hyperlipidemia ?-History of very premature CAD.  Underwent drug-eluting stent to ramus and RCA in August 2021.  Remains on aspirin 81 mg daily.  Has symptoms of chest discomfort when having her menstrual cycle.  Plans for cardiac PET as above.  She will continue Lipitor 80 mg daily.  Apparently she stopped taking this and her LDL cholesterol was very elevated.  Her blood pressure is controlled.  She is back on medications.  She will forward us the results of her cholesterol profile when obtained by her primary care physician in  the next few weeks.  It is very reassuring that her EKG is normal.  I do not believe she has any recurrent epicardial stenoses.  Again we will exclude any small vessel disease with cardiac PET. ? ?4. Primary

## 2022-03-10 ENCOUNTER — Ambulatory Visit (INDEPENDENT_AMBULATORY_CARE_PROVIDER_SITE_OTHER): Payer: BC Managed Care – PPO | Admitting: Cardiovascular Disease

## 2022-03-10 ENCOUNTER — Encounter: Payer: Self-pay | Admitting: Cardiovascular Disease

## 2022-03-10 ENCOUNTER — Other Ambulatory Visit: Payer: Self-pay

## 2022-03-10 VITALS — BP 134/86 | HR 69 | Ht 64.0 in | Wt 182.0 lb

## 2022-03-10 DIAGNOSIS — E782 Mixed hyperlipidemia: Secondary | ICD-10-CM

## 2022-03-10 DIAGNOSIS — I2510 Atherosclerotic heart disease of native coronary artery without angina pectoris: Secondary | ICD-10-CM | POA: Diagnosis not present

## 2022-03-10 DIAGNOSIS — R072 Precordial pain: Secondary | ICD-10-CM | POA: Diagnosis not present

## 2022-03-10 DIAGNOSIS — I10 Essential (primary) hypertension: Secondary | ICD-10-CM

## 2022-03-10 NOTE — Patient Instructions (Signed)
Medication Instructions:  °The current medical regimen is effective;  continue present plan and medications. ° °*If you need a refill on your cardiac medications before your next appointment, please call your pharmacy* ° ° °Follow-Up: °At [facility], you and your health needs are our priority.  As part of our continuing mission to provide you with exceptional heart care, we have created designated Provider Care Teams.  These Care Teams include your primary Cardiologist (physician) and Advanced Practice Providers (APPs -  Physician Assistants and Nurse Practitioners) who all work together to provide you with the care you need, when you need it. ° °We recommend signing up for the patient portal called "MyChart".  Sign up information is provided on this After Visit Summary.  MyChart is used to connect with patients for Virtual Visits (Telemedicine).  Patients are able to view lab/test results, encounter notes, upcoming appointments, etc.  Non-urgent messages can be sent to your provider as well.   °To learn more about what you can do with MyChart, go to [email or web address].   ° °Your next appointment:   °12 month(s) ° °The format for your next appointment:   °In Person ° °Provider:   °[place] T O'Neal, MD   ° ° ° °

## 2022-03-17 ENCOUNTER — Telehealth: Payer: Self-pay

## 2022-03-17 NOTE — Telephone Encounter (Signed)
Contacted patient to see if she was interested in doing the Cardiac PET- LVM for patient to call back.  ?Left call back number.  ? ?

## 2022-03-21 ENCOUNTER — Other Ambulatory Visit (HOSPITAL_COMMUNITY): Payer: Self-pay | Admitting: Cardiovascular Disease

## 2022-03-21 ENCOUNTER — Encounter: Payer: Self-pay | Admitting: Cardiovascular Disease

## 2022-03-21 DIAGNOSIS — I2510 Atherosclerotic heart disease of native coronary artery without angina pectoris: Secondary | ICD-10-CM

## 2022-03-23 NOTE — Telephone Encounter (Signed)
Contacted patient, she was contacted and set up for Cardiac PET, patient aware of her appointment- thankful for call.  ? ? ?

## 2022-03-25 ENCOUNTER — Telehealth (HOSPITAL_COMMUNITY): Payer: Self-pay | Admitting: Emergency Medicine

## 2022-03-25 ENCOUNTER — Encounter (HOSPITAL_COMMUNITY): Payer: Self-pay

## 2022-03-25 NOTE — Telephone Encounter (Signed)
Attempted to call patient regarding upcoming cardiac PET appointment. Left message on voicemail with name and callback number Bradlee Heitman RN Navigator Cardiac Imaging [facility] [phone] Office [phone] Cell  

## 2022-03-29 ENCOUNTER — Ambulatory Visit (HOSPITAL_COMMUNITY)
Admission: RE | Admit: 2022-03-29 | Discharge: 2022-03-29 | Disposition: A | Discharge status: Home / Self Care | Payer: BC Managed Care – PPO | Source: Ambulatory Visit | Attending: Cardiovascular Disease | Admitting: Cardiovascular Disease

## 2022-03-29 DIAGNOSIS — I2510 Atherosclerotic heart disease of native coronary artery without angina pectoris: Secondary | ICD-10-CM | POA: Diagnosis present

## 2022-03-29 LAB — NM PET CT CARDIAC PERFUSION MULTI W/ABSOLUTE BLOODFLOW
LV dias vol: 90 mL (ref 46–106)
MBFR: 3.3
Nuc Rest EF: 54 %
Nuc Stress EF: 65 %
Peak HR: 103 {beats}/min
Rest HR: 66 {beats}/min
Rest MBF: 0.8 ml/g/min
Rest Nuclear Isotope Dose: 21.6 mCi
Rest perfusion cavity size (mL): 90 mL
ST Depression (mm): 0 mm
Stress MBF: 2.6 ml/g/min
Stress Nuclear Isotope Dose: 21.4 mCi
Stress perfusion cavity size (mL): 81 mL
TID: 0.92

## 2022-03-29 MED ORDER — REGADENOSON 0.4 MG/5ML IV SOLN
INTRAVENOUS | Status: AC
  Filled 2022-03-29: qty 5

## 2022-03-29 MED ORDER — RUBIDIUM RB82 GENERATOR (RUBYFILL)
21.6000 | PACK | Freq: Once | INTRAVENOUS | Status: AC
  Administered 2022-03-29: 21.6 via INTRAVENOUS

## 2022-03-29 MED ORDER — RUBIDIUM RB82 GENERATOR (RUBYFILL)
21.3700 | PACK | Freq: Once | INTRAVENOUS | Status: AC
  Administered 2022-03-29: 21.37 via INTRAVENOUS

## 2022-03-30 ENCOUNTER — Encounter: Payer: Self-pay | Admitting: Cardiovascular Disease

## 2022-05-24 ENCOUNTER — Encounter: Payer: Self-pay | Admitting: *Deleted

## 2022-06-30 IMAGING — PT NM PET CT CARDIAC PERF MULTI W/ABSOLUTE BLOODFLOW
8 series · 25 of 25 positions shown · non-contrast
Comparison: None.

EXAM:
OVER-READ INTERPRETATION  CT CHEST

The following report is an over-read performed by radiologist Dr.
Csernai Mattos [REDACTED] on 03/29/2022. This
over-read does not include interpretation of cardiac or coronary
anatomy or pathology. The PET-CT interpretation by the cardiologist
is attached.

[Series 3: ct rest · axial · 3.0mm · 0.92mm/px · 1 of 83 slices shown]
[im 1/83  soft-tissue]
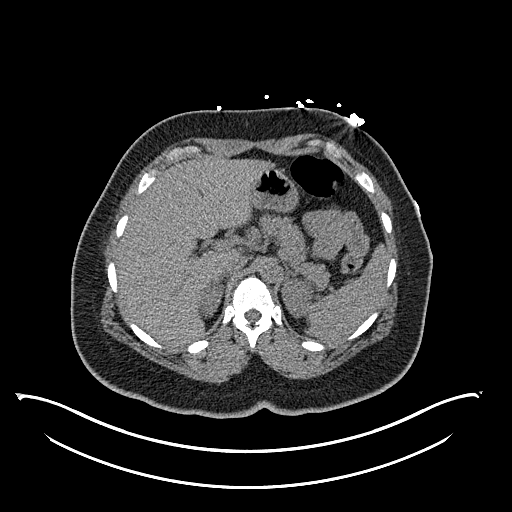

[Series 4: ct rest lung · axial · 3.0mm · 0.92mm/px · 1 of 83 slices shown]
[im 1/83  soft-tissue]
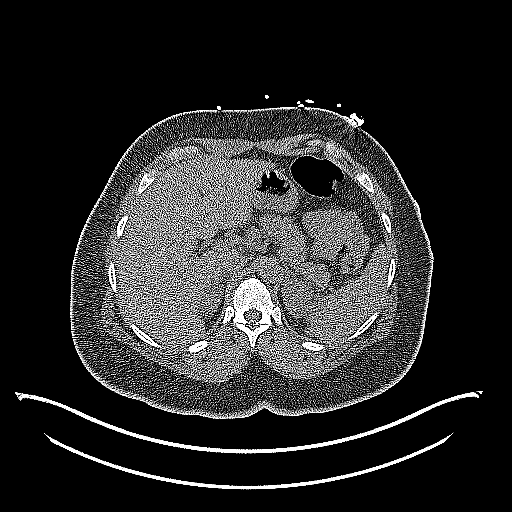

[Series 8: pet rest cardiac dynamic · axial · 3.0mm · 2.04mm/px · z∈[-587,-423]mm · 8 of 2239 slices shown]
[im 1/2239]
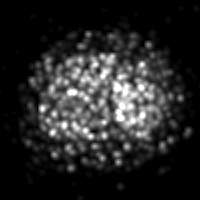
[im 320/2239]
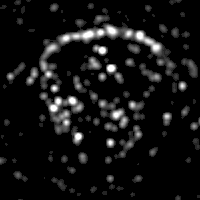
[im 640/2239]
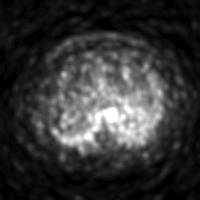
[im 960/2239]
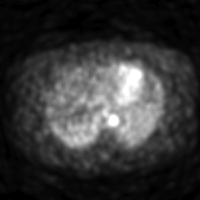
[im 1279/2239]
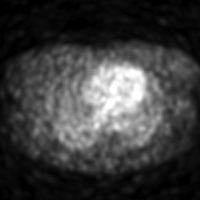
[im 1599/2239]
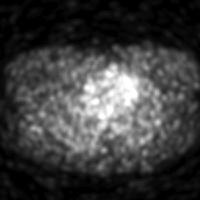
[im 1919/2239]
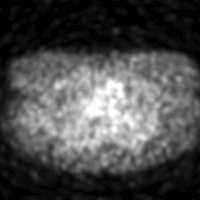
[im 2239/2239]
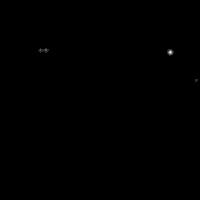

[Series 9: pet rest cardiac gated · axial · 3.0mm · 2.04mm/px · z∈[-587,-423]mm · 2 of 664 slices shown]
[im 1/664]
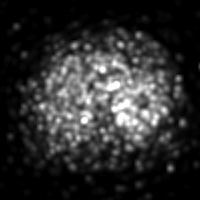
[im 664/664]
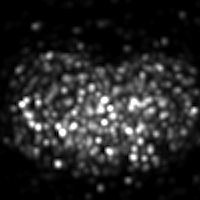

[Series 10: pet rest cardiac static · axial · 3.0mm · 2.04mm/px · 1 of 83 slices shown]
[im 1/83]
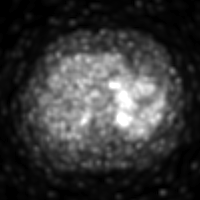

[Series 11: pet stress cardiac dynamic · axial · 3.0mm · 2.04mm/px · z∈[-587,-423]mm · 9 of 2401 slices shown]
[im 1/2401]
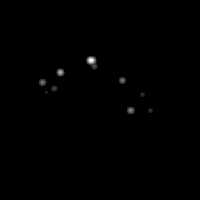
[im 301/2401]
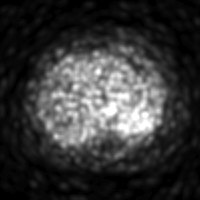
[im 601/2401]
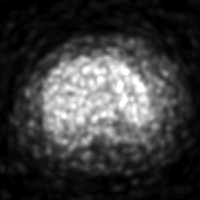
[im 901/2401]
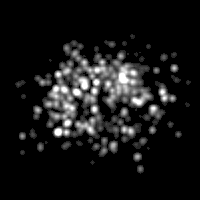
[im 1201/2401]
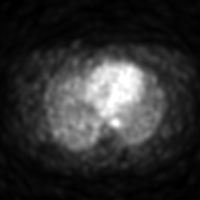
[im 1501/2401]
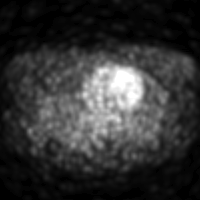
[im 1801/2401]
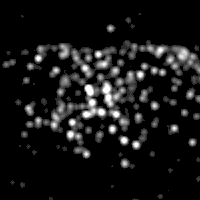
[im 2101/2401]
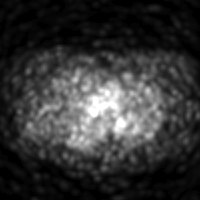
[im 2401/2401]
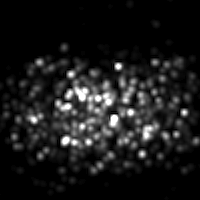

[Series 14: pet stress cardiac (id) · axial · 3.0mm · 2.04mm/px · z∈[-587,-423]mm · 2 of 664 slices shown]
[im 1/664]
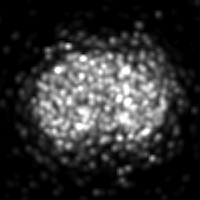
[im 664/664]
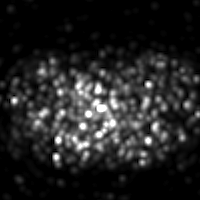

[Series 15: pet stress cardiac ac (id) · axial · 3.0mm · 2.04mm/px · 1 of 83 slices shown]
[im 1/83]
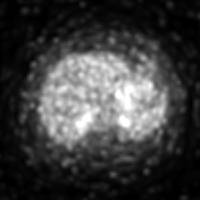

[25 of 25 positions shown; findings below may reference images not displayed]

FINDINGS: Vascular: Normal heart size. No pericardial effusion. Coronary
artery calcifications of the LAD and RCA.

Mediastinum/Nodes: Esophagus is unremarkable. No pathologically
enlarged lymph nodes seen in the chest.

Lungs/Pleura: Central airways are patent. No consolidation, pleural
effusion or pneumothorax.

Upper Abdomen: No acute abnormality.

Musculoskeletal: No chest wall mass or suspicious bone lesions
identified.
IMPRESSION: 1. No acute extracardiac abnormality.
2. Coronary artery calcifications of the LAD and RCA.

## 2022-07-21 ENCOUNTER — Ambulatory Visit: Payer: BC Managed Care – PPO

## 2022-07-21 NOTE — Progress Notes (Deleted)
    SUBJECTIVE:   CHIEF COMPLAINT / HPI:   Neck Pain Denies any bladder/bowel incontinence, gait abnormalities, lower extremity weakness. ***  Fever/chills? IVDU? Immunosuppression? Chronic steroid use? Unintentional weight loss? Hx of cancer? Headache, shoulder, hip girdle pain or vision changes? [facility] rheumatica/GCA) Anterior neck pain and/or associated chest pain?  PERTINENT  PMH / PSH: hx of NSTEMI 08/03/2020, htn, CAD, HLD  OBJECTIVE:   There were no vitals taken for this visit.  Lhermitte's sign***  ASSESSMENT/PLAN:   No problem-specific Assessment & Plan notes found for this encounter.     Levin Erp, MD [facility]
